# Patient Record
Sex: Male | Born: 1955 | Race: White | Hispanic: No | Marital: Single | Smoking: Former smoker
Health system: Southern US, Community
[De-identification: ages and names within clinical notes are randomized; demographics above are authoritative.]

## PROBLEM LIST (undated history)

## (undated) DIAGNOSIS — M1990 Unspecified osteoarthritis, unspecified site: Secondary | ICD-10-CM

## (undated) DIAGNOSIS — K219 Gastro-esophageal reflux disease without esophagitis: Secondary | ICD-10-CM

## (undated) DIAGNOSIS — I10 Essential (primary) hypertension: Secondary | ICD-10-CM

## (undated) DIAGNOSIS — Z8619 Personal history of other infectious and parasitic diseases: Secondary | ICD-10-CM

## (undated) DIAGNOSIS — D649 Anemia, unspecified: Secondary | ICD-10-CM

## (undated) DIAGNOSIS — G4730 Sleep apnea, unspecified: Secondary | ICD-10-CM

## (undated) DIAGNOSIS — G629 Polyneuropathy, unspecified: Secondary | ICD-10-CM

## (undated) DIAGNOSIS — Z8719 Personal history of other diseases of the digestive system: Secondary | ICD-10-CM

## (undated) DIAGNOSIS — K5903 Drug induced constipation: Secondary | ICD-10-CM

## (undated) HISTORY — PX: OTHER SURGICAL HISTORY: SHX169

## (undated) HISTORY — PX: BACK SURGERY: SHX140

## (undated) HISTORY — PX: ANTERIOR CRUCIATE LIGAMENT REPAIR: SHX115

## (undated) HISTORY — PX: JOINT REPLACEMENT: SHX530

## (undated) HISTORY — PX: MENISCUS REPAIR: SHX5179

---

## 1998-02-10 ENCOUNTER — Other Ambulatory Visit: Admission: RE | Admit: 1998-02-10 | Discharge: 1998-02-10 | Payer: Self-pay | Admitting: *Deleted

## 2003-09-20 HISTORY — PX: CARDIAC CATHETERIZATION: SHX172

## 2004-01-28 ENCOUNTER — Inpatient Hospital Stay (HOSPITAL_COMMUNITY): Admission: EM | Admit: 2004-01-28 | Discharge: 2004-01-29 | Payer: Self-pay | Admitting: Emergency Medicine

## 2006-04-16 ENCOUNTER — Encounter: Admission: RE | Admit: 2006-04-16 | Discharge: 2006-04-16 | Payer: Self-pay | Admitting: Orthopedic Surgery

## 2009-03-18 ENCOUNTER — Inpatient Hospital Stay (HOSPITAL_COMMUNITY): Admission: RE | Admit: 2009-03-18 | Discharge: 2009-03-21 | Payer: Self-pay | Admitting: Orthopedic Surgery

## 2009-04-10 ENCOUNTER — Encounter (INDEPENDENT_AMBULATORY_CARE_PROVIDER_SITE_OTHER): Payer: Self-pay | Admitting: Orthopedic Surgery

## 2009-04-10 ENCOUNTER — Inpatient Hospital Stay (HOSPITAL_COMMUNITY): Admission: AD | Admit: 2009-04-10 | Discharge: 2009-04-16 | Payer: Self-pay | Admitting: Orthopedic Surgery

## 2009-04-11 ENCOUNTER — Ambulatory Visit: Payer: Self-pay | Admitting: Infectious Diseases

## 2009-04-13 ENCOUNTER — Encounter: Payer: Self-pay | Admitting: Infectious Disease

## 2009-04-27 ENCOUNTER — Encounter: Payer: Self-pay | Admitting: Infectious Disease

## 2009-04-28 ENCOUNTER — Telehealth: Payer: Self-pay | Admitting: Infectious Disease

## 2009-05-04 ENCOUNTER — Encounter: Payer: Self-pay | Admitting: Infectious Disease

## 2009-05-08 ENCOUNTER — Encounter: Payer: Self-pay | Admitting: Infectious Diseases

## 2009-05-11 ENCOUNTER — Encounter: Payer: Self-pay | Admitting: Vascular Surgery

## 2009-05-11 ENCOUNTER — Ambulatory Visit: Payer: Self-pay | Admitting: Infectious Diseases

## 2009-05-11 DIAGNOSIS — T8489XA Other specified complication of internal orthopedic prosthetic devices, implants and grafts, initial encounter: Secondary | ICD-10-CM | POA: Insufficient documentation

## 2009-05-11 DIAGNOSIS — D638 Anemia in other chronic diseases classified elsewhere: Secondary | ICD-10-CM

## 2009-05-22 ENCOUNTER — Encounter: Payer: Self-pay | Admitting: Infectious Diseases

## 2009-05-26 ENCOUNTER — Ambulatory Visit: Payer: Self-pay | Admitting: Infectious Diseases

## 2009-07-21 ENCOUNTER — Ambulatory Visit: Payer: Self-pay | Admitting: Infectious Diseases

## 2009-07-21 DIAGNOSIS — R110 Nausea: Secondary | ICD-10-CM | POA: Insufficient documentation

## 2009-07-21 LAB — CONVERTED CEMR LAB
ALT: 11 units/L (ref 0–53)
Albumin: 4.2 g/dL (ref 3.5–5.2)
Alkaline Phosphatase: 60 units/L (ref 39–117)
Basophils Absolute: 0 10*3/uL (ref 0.0–0.1)
CRP: 0.3 mg/dL (ref <0.6)
Creatinine, Ser: 1.02 mg/dL (ref 0.40–1.50)
Eosinophils Absolute: 0.3 10*3/uL (ref 0.0–0.7)
HCT: 43.5 % (ref 39.0–52.0)
Hemoglobin: 14 g/dL (ref 13.0–17.0)
MCV: 89 fL (ref >=78.0)
Monocytes Relative: 13 % — ABNORMAL HIGH (ref 3–12)
Neutrophils Relative %: 45 % (ref 43–77)
RBC: 4.89 M/uL (ref 4.22–5.81)
RDW: 15.7 % — ABNORMAL HIGH (ref 11.5–15.5)
Sodium: 134 meq/L — ABNORMAL LOW (ref 135–145)
Total Bilirubin: 0.3 mg/dL (ref 0.3–1.2)
Total Protein: 7.9 g/dL (ref 6.0–8.3)
WBC: 5.6 10*3/uL (ref 4.0–10.5)

## 2009-10-20 ENCOUNTER — Ambulatory Visit: Payer: Self-pay | Admitting: Infectious Diseases

## 2009-10-20 LAB — CONVERTED CEMR LAB: Sed Rate: 8 mm/hr (ref 0–16)

## 2010-03-18 ENCOUNTER — Observation Stay (HOSPITAL_COMMUNITY): Admission: RE | Admit: 2010-03-18 | Discharge: 2010-03-19 | Payer: Self-pay | Admitting: Specialist

## 2010-10-19 NOTE — Assessment & Plan Note (Signed)
Summary: F/U/VS   Primary Provider:  Netta Corrigan  CC:  3 month follow up.  History of Present Illness: 55 yo with h/o oa and prior TKR.   Developed MSSA post op R TKR infection s/p I and D by Dr Netta Corrigan july 23 and july 25th.  On vanco and rif (has pcn allergy).  When seen oct 2010 was doing well with esr down to 5 on doxy and rifampin (had picc line removed in sept and started oral tail att that time. He finished rifampin several days ago. CURRENTLY He finished rifampin several days ago. He is back to work and quite active.  Has some occas stiffness but no redness, swelling or heat.  no fevers chills.   Knee feels fine per pt.   site healed, no pain fevers or chills.  only c/o is indigestion and thinks related to the abx,.  Continues on doxy and rifampin.   Preventive Screening-Counseling & Management  Alcohol-Tobacco     Alcohol drinks/day: 0     Smoking Status: quit     Year Quit: 2006  Caffeine-Diet-Exercise     Caffeine use/day: soda and coffee     Does Patient Exercise: yes     Type of exercise: active at work     Exercise (avg: min/session): >60     Times/week: 5  Safety-Violence-Falls     Seat Belt Use: yes   Updated Prior Medication List: DOXYCYCLINE HYCLATE 100 MG CAPS (DOXYCYCLINE HYCLATE) one by mouth two times a day  Current Allergies (reviewed today): ! PCN Past History:  Past Medical History: Last updated: 05/11/2009 1. He has a history of degenerative arthritis right knee.   2. Hypertension.   3. Hiatal hernia with reflux.   Past Surgical History: Last updated: 05/11/2009 TKR  Family History: Last updated: 05/11/2009 [place]  Social History: Last updated: 05/11/2009 works as truck Hospital doctor  Risk Factors: Alcohol Use: 0 (10/20/2009) Caffeine Use: soda and coffee (10/20/2009) Exercise: yes (10/20/2009)  Risk Factors: Smoking Status: quit (10/20/2009)  Review of Systems       11 systems reviewed and negative except per HPI   Vital  Signs:  Patient profile:   55 year old male Height:      72 inches (182.88 cm) Weight:      322.6 pounds (146.64 kg) BMI:     43.91 Temp:     98.1 degrees F (36.72 degrees C) oral Pulse rate:   87 / minute BP sitting:   139 / 84  (left arm)  Vitals Entered By: Baxter Hire) (October 20, 2009 10:34 AM) CC: 3 month follow up Is Patient Diabetic? No Pain Assessment Patient in pain? no      Nutritional Status BMI of > 30 = obese Nutritional Status Detail appetite is fine per patient  Does patient need assistance? Functional Status Self care Ambulation Normal   Physical Exam  General:  alert and well-developed.   Eyes:  vision grossly intact.   Mouth:  good dentition and no gingival abnormalities.   Neck:  supple.   Lungs:  normal respiratory effort and normal breath sounds.   Heart:  normal rate and regular rhythm.   Abdomen:  soft.   Msk:  normal ROM, no joint swelling, and no joint warmth.    R knee very well healed.   Extremities:  no cce Neurologic:  alert & oriented X3 and cranial nerves II-XII intact.     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  s/p 6 wks IV vanco and rifampin by mouth.  In Sept we pulled picc despite ESR of 115 and started po doxy and rifampin.  Seen in Oct and doing well and wound looks great.  Some indigestion with abx.  Will repeat esr today and cont doxy but decrease to once a day for lifelong suppression in order  to prevent recurrence and need for joint revision.  Can dc rifampin. He will return if any new or concenring sxs, otherwise he can follow with Dr Andrey Campanile for a yearly refill on his doxy. I have advised regarding photosensitivity with doxycycline.  Orders: T-Sed Rate (Automated) (29528-41324) Est. Patient Level IV (40102)  Problem # 2:  NAUSEA (ICD-787.02) should improved as abx dosing decreased  Patient  Instructions: 1)  Follow up as needed. 2)  Take the doxycyline once a day for lifelong. 3)    Call if new redness pain or swelling at knee or fevers or other symtptoms of concern Prescriptions: DOXYCYCLINE HYCLATE 100 MG CAPS (DOXYCYCLINE HYCLATE) one by mouth two times a day  #180 x 3   Entered and Authorized by:   Clydie Braun MD   Signed by:   Clydie Braun MD on 10/20/2009   Method used:   Print then Give to Patient   RxID:   [id number]  Process Orders Check Orders Results:     [facility]: ABN not required for this insurance Tests Sent for requisitioning (October 20, 2009 11:00 AM):     10/20/2009: [facility] -- T-Sed Rate (Automated) [[id number]] (signed)

## 2010-12-05 LAB — APTT: aPTT: 34 seconds (ref 24–37)

## 2010-12-05 LAB — COMPREHENSIVE METABOLIC PANEL
ALT: 17 U/L (ref 0–53)
AST: 27 U/L (ref 0–37)
Albumin: 4.2 g/dL (ref 3.5–5.2)
Alkaline Phosphatase: 49 U/L (ref 39–117)
BUN: 18 mg/dL (ref 6–23)
CO2: 27 mEq/L (ref 19–32)
Calcium: 10.3 mg/dL (ref 8.4–10.5)
Chloride: 100 mEq/L (ref 96–112)
Creatinine, Ser: 0.84 mg/dL (ref 0.4–1.5)
GFR calc Af Amer: >60 mL/min (ref >60)
GFR calc non Af Amer: >60 mL/min (ref >60)
Glucose, Bld: 94 mg/dL (ref 70–99)
Potassium: 4.4 mEq/L (ref 3.5–5.1)
Sodium: 136 mEq/L (ref 135–145)
Total Bilirubin: 0.6 mg/dL (ref 0.3–1.2)
Total Protein: 8 g/dL (ref 6.0–8.3)

## 2010-12-05 LAB — PROTIME-INR: Prothrombin Time: 13 seconds (ref 11.6–15.2)

## 2010-12-05 LAB — CBC
HCT: 44.6 % (ref 39.0–52.0)
Hemoglobin: 15.4 g/dL (ref 13.0–17.0)
MCH: 31.8 pg (ref 26.0–34.0)
MCHC: 34.7 g/dL (ref 30.0–36.0)
MCV: 91.7 fL (ref 78.0–100.0)
Platelets: 233 10*3/uL (ref 150–400)
RBC: 4.86 MIL/uL (ref 4.22–5.81)
RDW: 13 % (ref 11.5–15.5)
WBC: 6.7 10*3/uL (ref 4.0–10.5)

## 2010-12-05 LAB — URINALYSIS, ROUTINE W REFLEX MICROSCOPIC
Bilirubin Urine: NEGATIVE
Glucose, UA: NEGATIVE mg/dL
Hgb urine dipstick: NEGATIVE
Ketones, ur: NEGATIVE mg/dL
Nitrite: NEGATIVE
Protein, ur: NEGATIVE mg/dL
Specific Gravity, Urine: 1.015 (ref 1.005–1.030)
Urobilinogen, UA: 0.2 mg/dL (ref 0.0–1.0)
pH: 6.5 (ref 5.0–8.0)

## 2010-12-26 LAB — URINE MICROSCOPIC-ADD ON

## 2010-12-26 LAB — PROTIME-INR
INR: 1.1 (ref 0.00–1.49)
INR: 1.2 (ref 0.00–1.49)
INR: 1.2 (ref 0.00–1.49)
INR: 1.2 (ref 0.00–1.49)
INR: 1.3 (ref 0.00–1.49)
INR: 1.6 — ABNORMAL HIGH (ref 0.00–1.49)
INR: 1.5 (ref 0.00–1.49)
Prothrombin Time: 17 seconds — ABNORMAL HIGH (ref 11.6–15.2)
Prothrombin Time: 19.4 seconds — ABNORMAL HIGH (ref 11.6–15.2)
Prothrombin Time: 14 seconds (ref 11.6–15.2)
Prothrombin Time: 17.4 seconds — ABNORMAL HIGH (ref 11.6–15.2)
Prothrombin Time: 19.1 seconds — ABNORMAL HIGH (ref 11.6–15.2)

## 2010-12-26 LAB — CBC
HCT: 27.5 % — ABNORMAL LOW (ref 39.0–52.0)
HCT: 29.1 % — ABNORMAL LOW (ref 39.0–52.0)
HCT: 29.1 % — ABNORMAL LOW (ref 39.0–52.0)
HCT: 30.3 % — ABNORMAL LOW (ref 39.0–52.0)
HCT: 32.2 % — ABNORMAL LOW (ref 39.0–52.0)
HCT: 34.4 % — ABNORMAL LOW (ref 39.0–52.0)
Hemoglobin: 11.6 g/dL — ABNORMAL LOW (ref 13.0–17.0)
Hemoglobin: 9.3 g/dL — ABNORMAL LOW (ref 13.0–17.0)
Hemoglobin: 9.6 g/dL — ABNORMAL LOW (ref 13.0–17.0)
Hemoglobin: 9.9 g/dL — ABNORMAL LOW (ref 13.0–17.0)
MCHC: 32 g/dL (ref 30.0–36.0)
MCHC: 33 g/dL (ref 30.0–36.0)
MCHC: 33.7 g/dL (ref 30.0–36.0)
MCHC: 33.7 g/dL (ref 30.0–36.0)
MCV: 90.8 fL (ref 78.0–100.0)
MCV: 91.2 fL (ref 78.0–100.0)
Platelets: 333 10*3/uL (ref 150–400)
Platelets: 342 10*3/uL (ref 150–400)
Platelets: 358 10*3/uL (ref 150–400)
Platelets: 379 10*3/uL (ref 150–400)
RBC: 3.21 MIL/uL — ABNORMAL LOW (ref 4.22–5.81)
RBC: 3.48 MIL/uL — ABNORMAL LOW (ref 4.22–5.81)
RBC: 3.81 MIL/uL — ABNORMAL LOW (ref 4.22–5.81)
RDW: 13.8 % (ref 11.5–15.5)
RDW: 13.9 % (ref 11.5–15.5)
RDW: 14.2 % (ref 11.5–15.5)
RDW: 14.3 % (ref 11.5–15.5)
RDW: 15 % (ref 11.5–15.5)
WBC: 11.4 10*3/uL — ABNORMAL HIGH (ref 4.0–10.5)
WBC: 19 10*3/uL — ABNORMAL HIGH (ref 4.0–10.5)
WBC: 7.2 10*3/uL (ref 4.0–10.5)
WBC: 7.6 10*3/uL (ref 4.0–10.5)

## 2010-12-26 LAB — BODY FLUID CULTURE

## 2010-12-26 LAB — COMPREHENSIVE METABOLIC PANEL
ALT: 14 U/L (ref 0–53)
Alkaline Phosphatase: 66 U/L (ref 39–117)
Alkaline Phosphatase: 48 U/L (ref 39–117)
BUN: 20 mg/dL (ref 6–23)
BUN: 26 mg/dL — ABNORMAL HIGH (ref 6–23)
CO2: 25 mEq/L (ref 19–32)
CO2: 25 mEq/L (ref 19–32)
Calcium: 9.2 mg/dL (ref 8.4–10.5)
Chloride: 98 mEq/L (ref 96–112)
GFR calc non Af Amer: 36 mL/min — ABNORMAL LOW (ref >60)
Glucose, Bld: 140 mg/dL — ABNORMAL HIGH (ref 70–99)
Potassium: 3.9 mEq/L (ref 3.5–5.1)
Potassium: 5.3 mEq/L — ABNORMAL HIGH (ref 3.5–5.1)
Sodium: 130 mEq/L — ABNORMAL LOW (ref 135–145)
Sodium: 132 mEq/L — ABNORMAL LOW (ref 135–145)
Total Bilirubin: 0.7 mg/dL (ref 0.3–1.2)
Total Protein: 6.4 g/dL (ref 6.0–8.3)

## 2010-12-26 LAB — BASIC METABOLIC PANEL
BUN: 17 mg/dL (ref 6–23)
CO2: 28 mEq/L (ref 19–32)
Calcium: 8.8 mg/dL (ref 8.4–10.5)
Chloride: 98 mEq/L (ref 96–112)
Creatinine, Ser: 1.89 mg/dL — ABNORMAL HIGH (ref 0.4–1.5)
GFR calc Af Amer: 46 mL/min — ABNORMAL LOW (ref >60)
GFR calc Af Amer: >60 mL/min (ref >60)
GFR calc non Af Amer: 38 mL/min — ABNORMAL LOW (ref >60)
GFR calc non Af Amer: >60 mL/min (ref >60)
Potassium: 4.4 mEq/L (ref 3.5–5.1)
Sodium: 127 mEq/L — ABNORMAL LOW (ref 135–145)
Sodium: 134 mEq/L — ABNORMAL LOW (ref 135–145)

## 2010-12-26 LAB — WOUND CULTURE

## 2010-12-26 LAB — ANAEROBIC CULTURE

## 2010-12-26 LAB — URINALYSIS, ROUTINE W REFLEX MICROSCOPIC
Hgb urine dipstick: NEGATIVE
Ketones, ur: NEGATIVE mg/dL
Leukocytes, UA: NEGATIVE
Specific Gravity, Urine: 1.027 (ref 1.005–1.030)
Urobilinogen, UA: 0.2 mg/dL (ref 0.0–1.0)

## 2010-12-26 LAB — HEMOGLOBIN AND HEMATOCRIT, BLOOD
HCT: 29.6 % — ABNORMAL LOW (ref 39.0–52.0)
HCT: 30 % — ABNORMAL LOW (ref 39.0–52.0)
HCT: 31.3 % — ABNORMAL LOW (ref 39.0–52.0)
HCT: 37.4 % — ABNORMAL LOW (ref 39.0–52.0)
Hemoglobin: 10.3 g/dL — ABNORMAL LOW (ref 13.0–17.0)
Hemoglobin: 10.3 g/dL — ABNORMAL LOW (ref 13.0–17.0)
Hemoglobin: 10.9 g/dL — ABNORMAL LOW (ref 13.0–17.0)

## 2010-12-26 LAB — CREATININE, SERUM
Creatinine, Ser: 0.8 mg/dL (ref 0.4–1.5)
GFR calc non Af Amer: >60 mL/min (ref >60)

## 2010-12-26 LAB — APTT
aPTT: 33 seconds (ref 24–37)
aPTT: 33 seconds (ref 24–37)
aPTT: 36 seconds (ref 24–37)
aPTT: 48 seconds — ABNORMAL HIGH (ref 24–37)
aPTT: 53 seconds — ABNORMAL HIGH (ref 24–37)

## 2010-12-26 LAB — VANCOMYCIN, TROUGH: Vancomycin Tr: 17.8 ug/mL (ref 10.0–20.0)

## 2010-12-27 LAB — COMPREHENSIVE METABOLIC PANEL
AST: 23 U/L (ref 0–37)
Albumin: 3.9 g/dL (ref 3.5–5.2)
Alkaline Phosphatase: 58 U/L (ref 39–117)
BUN: 16 mg/dL (ref 6–23)
GFR calc Af Amer: >60 mL/min (ref >60)
Potassium: 4.2 mEq/L (ref 3.5–5.1)
Total Protein: 7.5 g/dL (ref 6.0–8.3)

## 2010-12-27 LAB — URINALYSIS, ROUTINE W REFLEX MICROSCOPIC
Nitrite: NEGATIVE
Specific Gravity, Urine: 1.021 (ref 1.005–1.030)
pH: 6 (ref 5.0–8.0)

## 2010-12-27 LAB — CBC
HCT: 45.2 % (ref 39.0–52.0)
Platelets: 218 10*3/uL (ref 150–400)
RDW: 13.4 % (ref 11.5–15.5)
WBC: 8.6 10*3/uL (ref 4.0–10.5)

## 2010-12-27 LAB — DIFFERENTIAL
Eosinophils Relative: 4 % (ref 0–5)
Lymphocytes Relative: 27 % (ref 12–46)
Monocytes Absolute: 0.8 10*3/uL (ref 0.1–1.0)
Monocytes Relative: 9 % (ref 3–12)
Neutro Abs: 5.1 10*3/uL (ref 1.7–7.7)

## 2010-12-27 LAB — APTT: aPTT: 30 seconds (ref 24–37)

## 2010-12-27 LAB — TYPE AND SCREEN
ABO/RH(D): A POS
Antibody Screen: NEGATIVE

## 2011-02-01 NOTE — Discharge Summary (Signed)
[name], [name]               ACCOUNT NO.:  [id number]   MEDICAL RECORD NO.:  [id number]          PATIENT TYPE:  INP   LOCATION:  1615                         FACILITY:  [facility]   PHYSICIAN:  Georges Lynch. Gioffre, M.D.DATE OF BIRTH:  03-14-1956   DATE OF ADMISSION:  03/18/2009  DATE OF DISCHARGE:  03/21/2009                               DISCHARGE SUMMARY   ADMITTING DIAGNOSES:  1. Degenerative arthritis of right knee.  2. Hypertension.  3. Hiatal hernia with reflux.  4. Arthritis.   DISCHARGE DIAGNOSES:  1. Degenerative arthritis of right knee status post right total knee      replacement.  2. Hypertension.  3. Hiatal hernia with reflux.  4. Arthritis.  5. Postoperative hyponatremia.  6. Postoperative hyperkalemia.   PROCEDURE:  On 03/18/2009 right total knee and removal of two ACL screws  was performed by Dr. Darrelyn Hillock.  Assistant Rozell Searing, PA-C.   CONSULTATIONS:  None.   BRIEF HISTORY:  The patient is a 55 year old white male with end-stage  degenerative arthritis in his right knee.  The patient has had previous  ACL repair of the right knee, continues to have pain and now presents  for right total knee arthroplasty.   LABORATORY DATA:  Preoperative CBC shows a hemoglobin of 15.5 and  hematocrit of 45.2.  White cell count is 8.6 and platelets 218.  Chemistry panel is within normal limits.  PT INR 12.3 and 0.9 with a PTT  of 30.  Preoperative urinalysis is negative.  Serial CBCs were followed  during the patient's hospital stay.  The patient's hemoglobin dropped as  low at 10.3.  The patient was asymptomatic and did not require  transfusion.  The last hemoglobin and hematocrit prior to discharge was  10.3 and 29.6.  Serial BMETs were followed through the patient's  hospital stay.  The patient's sodium dropped to 132 and got as low at  127 postoperatively but increased to 134 on the day of discharge.  The  patient's potassium elevated to 5.3 postoperatively on  postoperative day  two but normalized to 4.1 by the next day.  Postoperative BUN and  creatinine elevated to 33 and 1.89.  This normalized to 17 and 1.08 by  discharge day.  The normalization was attributed to increase in IV  fluids and output.  EKG dated 11/07/2008:  Normal sinus rhythm, normal  axis, regular rate and rhythm.   HOSPITAL CARE:  The patient was admitted to [facility], tolerated the  procedure well, later transferred to the recovery room and then to an  orthopedic floor.  The patient was started on a PCA pump and p.o.  analgesics for pain control postoperatively.  The patient underwent  general anesthesia without complications.  On postoperative day one PCA  was discharged and the patient is to rely on p.o. medications for pain  control.  The patient has decreased urinary output and his labs reveal  an increase in his BUN and creatinine so IV fluids were increased.  The  patient received his first dose of heparin on the morning of  postoperative day one.  His  INR was 1.1.  The patient was able to  ambulate 114 feet with normal assistance on postoperative day one.  On  postoperative day two the patient's output has greatly improved.  His  BUN and creatinine remain elevated so we continue IV fluids and  encourage p.o. intake of fluids.  The patient's INR remains  subtherapeutic at 1.4.  Pharmacy continues heparin dosing.  On dressing  change the wound appears well healing without any signs of infection.  On postoperative day three also discharge day the patient's BUN and  creatinine have normalized and the patient's output has greatly  improved.  IV fluids are discharged.  The patient is discharged home.  He has been in contact with [facility] for home care and the patient has  good family support.  Hemoglobin on discharge is 10.3.  The patient is  asymptomatic as far as dizziness or feeling light-headed.  The patient's  INR is up to 1.5.  The patient will continue heparin  subcutaneously for  the next three days and then we will transition to Coumadin.  This will  be monitored by home health nurse.   DISCHARGE PLAN:  The patient is discharged home.  He will be seen  [place] for home therapy.  The patient will continue heparin times three  days and then will be on Coumadin for three weeks from the date of  surgery.  He will have weekly INRs taken by the home health nurse.   DISCHARGE DIAGNOSES:  Please see above.   DISCHARGE MEDICATIONS:  1. Lovenox 40 mg subcutaneous once daily at 11:00 times three days.  2. Coumadin once the Lovenox is finished given per [facility] pharmacy.  3. Percocet 10/650 one to two tablets p.o. q. 4-6 h. p.r.n. pain.  4. Robaxin 500 mg one tablet p.o. t.i.d. p.r.n. spasm.  5. Niaspan extended release 1000 mg p.o. two tablets at night time.  6. Nexium 40 mg p.o. one tablet at night time.  7. Lisinopril/hydrochlorothiazide 20/12.5 mg one tablet at night time.  8. Aspirin 325 mg.  Hold until the patient is off Coumadin.  9. Ibuprofen 200 mg. Hold until the patient is off Coumadin.   DIET:  Regular.  No restrictions.   ACTIVITY:  The patient is weightbearing as tolerated on his right lower  extremity.  [name] to come to the patient's home to continue gait  training, physical therapy and activities of daily living.  Patient may  start showing today.  He is not to submerge the wound and we have  discussed daily wound changes.   FOLLOWUP:  The patient needs to follow up in two weeks from the day of  surgery.  Please contact the office at [phone] to arrange appointment.   DISPOSITION:  Home.   CONDITION ON DISCHARGE:  Stable, doing very well.      Rozell Searing, PAC    ______________________________  Georges Lynch Darrelyn Hillock, M.D.   LD/MEDQ  D:  04/02/2009  T:  04/02/2009  Job:  [id number]

## 2011-02-01 NOTE — Op Note (Signed)
NAMEMAUDE, [name]               ACCOUNT NO.:  [id number]   MEDICAL RECORD NO.:  [id number]          PATIENT TYPE:  INP   LOCATION:  1617                         FACILITY:  [facility]   PHYSICIAN:  Georges Lynch. Gioffre, M.D.DATE OF BIRTH:  Aug 11, 1956   DATE OF PROCEDURE:  04/10/2009  DATE OF DISCHARGE:                               OPERATIVE REPORT   SURGEON:  Georges Lynch. Darrelyn Hillock, M.D.   ASSISTANT:  Nurse.   PREOPERATIVE DIAGNOSIS:  Rule out infected right total knee  arthroplasty.   POSTOPERATIVE DIAGNOSIS:  Rule out infected right total knee  arthroplasty.   OPERATION:  Incision and drainage and debridement of the right total  knee.   PROCEDURE:  Under general anesthesia routine orthopedic prep and draping  of the knee was carried out on the right.  No tourniquet was used.  The  antibiotics were withheld until the cultures were taken.  An incision  was made over the lower part of the original incision.  At this  particular time, there appeared to be a fatty necrotic type material  that was extruded from the wound.  There were multiple pieces of this  material.  I thoroughly suctioned out after I did cultures, and I sent  the material down for aerobic and anaerobic cultures and sensitivities  and Gram's stain.  I thoroughly water-picked the knee out with a pulse  irrigation system.  Following that, I inserted about 300 mL of  irrigation with antibiotic solution.  I then packed the wound open with  a antibiotic-soaked Kerlix.  A small part of the wound was closed  distally, the remaining was left open.  Note:  The patient was on  Coumadin preop, so I had to give him 10 mg of vitamin K.  His INR came  down to 1.6, and I felt because of the emergent need here that we should  proceed, and we did do well with that and had good control the bleeding.  The patient then was started on IV vancomycin following the cultures.  The plan will be to admit him the hospital, wait for the cultures, and  I  did call infectious disease for a consultation and follow-up.           ______________________________  Georges Lynch. Darrelyn Hillock, M.D.     RAG/MEDQ  D:  04/10/2009  T:  04/11/2009  Job:  [id number]

## 2011-02-01 NOTE — H&P (Signed)
[name], [name]               ACCOUNT NO.:  [id number]   MEDICAL RECORD NO.:  [id number]          PATIENT TYPE:  INP   LOCATION:  1617                         [facility]ACILITY:  [facility]   PHYSICIAN:  Georges Lynch. Gioffre, M.D.DATE OF BIRTH:  1956-02-28   DATE OF ADMISSION:  04/10/2009  DATE OF DISCHARGE:                              HISTORY & PHYSICAL   CHIEF COMPLAINT:  Pain and some drainage in the right total knee distal  wound site.  He is 3-1/2 weeks postop right total knee.   PAST MEDICAL HISTORY:  1. He has a history of degenerative arthritis right knee.  2. Hypertension.  3. Hiatal hernia with reflux.   SOCIAL HISTORY:  Negative.   ALLERGIES:  PENICILLIN.   MEDICATIONS:  Lisinopril, Nexium and Niaspan.  He was on Coumadin.   REVIEW OF SYSTEMS:  He has history of hypertension and a hiatal hernia.   PHYSICAL EXAMINATION:  VITAL SIGNS:  His blood pressure was 127/53,  pulse 102, respirations normal, temperature 100.3.  HEENT:  Exam was negative.  Oral cavity negative.  LUNGS:  Clear.  HEART:  Normal sinus rhythm with no murmur.  ABDOMEN:  Negative.  EXTREMITIES:  Left lower extremity normal.  Right lower extremity he has  an incision in the right lower extremity.  He had a small area about 1 x  1 cm that was draining some necrotic like material.  There is a question  of an infection here at this point.   X-RAYS:  None were necessary.   ADMITTING IMPRESSION:  1. Three and a half weeks postop right total knee.  2. Rule out the possibility of a superficial knee infection.   PLAN:  I admitted him directly from the office to the hospital and  scheduled him for that night, same night, for incision and drainage of  his right total knee.           ______________________________  Georges Lynch Darrelyn Hillock, M.D.     RAG/MEDQ  D:  04/10/2009  T:  04/11/2009  Job:  [id number]

## 2011-02-01 NOTE — Op Note (Signed)
[name], [name]               ACCOUNT NO.:  [id number]   MEDICAL RECORD NO.:  [id number]          PATIENT TYPE:  INP   LOCATION:  0004                         FACILITY:  [facility]   PHYSICIAN:  Georges Lynch. Gioffre, M.D.DATE OF BIRTH:  June 28, 1956   DATE OF PROCEDURE:  03/18/2009  DATE OF DISCHARGE:                               OPERATIVE REPORT   SURGEON:  Georges Lynch. Darrelyn Hillock, M.D.   OPERATION PERFORMED:  Rozell Searing, PAC.   PREOPERATIVE DIAGNOSES:  1. Previous anterior cruciate reconstruction by me, right knee.  2. Severe degenerative arthritis, right knee.   POSTOPERATIVE DIAGNOSES:  1. Previous anterior cruciate reconstruction by me, right knee.  2. Severe degenerative arthritis, right knee.   OPERATION:  1. Removal of two anterior cruciate ligament screws, one from the      femur and one from the tibia.  2. Right total knee arthroplasty utilizing DePuy system.   All three components were cemented with gentamicin in the cement.  We  utilized the HV bone cement.  The sizes of the prosthesis used:  The  tibial tray was a size 5, the insert was a size 6, 10 mm thickness  insert.  The femoral component was a size 6 right posterior stabilized  prosthesis and the patella was a size 41 mm with three pegs.   PROCEDURE:  Under general anesthesia, routine orthopedic prep and  draping of the right lower extremity was carried out.  The leg was  exsanguinated with an Esmarch and the tourniquet was elevated at 370  mmHg.  The total tourniquet time was 2 hours.  I did let the tourniquet  down to complete the closure of the wound.  The patient had 1 gram of IV  Ancef preop.  At this time, after the tourniquet was up the knee was  flexed and we made an incision over the anterior aspect of the right  knee.  At this time, two flaps were created.  I then carried out a  median parapatellar incision reflecting the patella laterally, flexed  the knee and did medial and lateral meniscectomies.  I  excised the  cruciates at this time.  I first identified the tibial screw and removed  that.  I then identified the femoral screw and removed that as well.  Following that, my initial drill hole was made in the femoral condyle  and the #1 jig was inserted and 12 mm thickness was removed the distal  femur.  Note, this knee was extremely tight, he is an extremely large  individual so a great deal of time was taken to do all our proper  releases.  Following this, a #2 jig was inserted.  We measured the femur  to be a size 6 femur.  Following that, we made our appropriate cuts with  our next jig for anterior, posterior and chamfer cuts for the right  femur.  After the femur was prepared we then prepared our tibia in the  usual fashion.  I removed the initially 12 mm thickness off the distal  femur.  For the tibia, we removed 4 mm off  the affected medial side.  We  utilized the intramedullary devices and to make our cuts.  We then cut  our keel cut out of the tibial plateau in the usual fashion.  Once this  was done we then cut our notch cut out of the distal femur.  We did that  after we removed the posterior spurs from his femur.  Great care was  taken to protect the posterior soft tissue structures.  We thoroughly  irrigated out the knee.  We made our groove cut out of the distal femur.  We inserted our trial components.  We had excellent stability, excellent  flexion and extension with a 10-mm thickness insert.  We then prepared  our patella.  We did a resurfacing procedure on the patella for a size  41 patella.  Three drill holes were made in the articular surface of the  patella.  Following that, we then removed all the trial components,  thoroughly water picked out the knee and cemented all three components  in simultaneously.  Note, I did put a bone plug in the screw hole of the  proximal tibia.  After the cement was hardened we removed all loose  pieces of cement.  We removed our trial  components of the tibia and then  thoroughly irrigated out the knee and inserted some more cement.  We  then packed a thrombin soaked Gelfoam posteriorly with some FloSeal.  I  then inserted my permanent rotating platform a size 6, 10 mm thickness,  reduced the knee.  We had excellent function.  I then inserted my  Hemovac drain and closed the wound in layers in the usual fashion.  The  tourniquet was let down with 2 hours and the remaining part of the soft  tissue structures were closed in the usual fashion.  Skin was closed  with metal staples.  Sterile Neosporin dressing was applied.   SURGEON:  Georges Lynch. Darrelyn Hillock, M.D.   ASSISTANT:  Rozell Searing, PAC.           ______________________________  Georges Lynch. Darrelyn Hillock, M.D.     RAG/MEDQ  D:  03/18/2009  T:  03/18/2009  Job:  [id number]

## 2011-02-01 NOTE — Discharge Summary (Signed)
NAMEMURTAZA, SHELL               ACCOUNT NO.:  [id number]   MEDICAL RECORD NO.:  [id number]          PATIENT TYPE:  INP   LOCATION:  1617                         FACILITY:  [facility]   PHYSICIAN:  Georges Lynch. Gioffre, M.D.DATE OF BIRTH:  14-May-1956   DATE OF ADMISSION:  04/10/2009  DATE OF DISCHARGE:  04/16/2009                               DISCHARGE SUMMARY   Mr. Glassco was admitted to the hospital by me with an infection of his  right total knee arthroplasty.  He was taken to surgery twice for this  debridement; the first time was on April 10, 2009 at 6:35 p.m.  He was  taken into surgery, and we did an incision and drainage and debridement  of his right total knee and cultures for aerobic and anaerobic.  Within  48 hours, he was taken back a second time on April 11, 2009 at 8:30 a.m.  Sunday morning for incision, drainage and debridement of his knee in  arthrotomy, as well.  His wound looked much better the second time  around.  Cultures were taken once again.  The initial culture showed  staph aureus.  The second culture, the second surgery, showed a few  staph aureus.  I immediately anticoagulated him with Lovenox plus  Coumadin.  I did call infectious disease immediately, and he was managed  with vancomycin IV.  His vancomycin was started after his first  debridement and after the cultures were taken.  Initially when he came  in, he was on Coumadin.  His INR was 1.6.  He was given some vitamin K.  We were able to easily control his bleeding the first time around.  There was no real problem, and no tourniquet was necessary.  Initially  his white count was 19,000.  Postoperatively, his white count returned  to normal.  He remained afebrile while in the hospital.  His wound was  packed open with antibiotic-soaked Kerlix, and that final one was  removed the day following his last debridement.  He was seen and managed  by infectious disease and started on rifampin 300 mg b.i.d., as well  as  the vancomycin protocol.  On April 13, 2009, he had his PICC line in, and  his wound vac was ordered.  His wound vac was monitored on a daily  basis.  I did notify Genevieve [facility]lander for plans for home health care.  I would  like to say, though, when I did his arthrotomy, there was just serous  fluid.  The arthrotomy was not done until I first cleaned out the  superficial infection.  I think his infection was mainly superficial.  He had necrotic fatty tissue.  There was no purulent material expressed  from the joint per se.  He remained afebrile, as I mentioned.  He was  seen on a daily basis, and finally on April 16, 2009, I elected to  discharge him to be followed in the office.  He was doing extremely  well.  I consulted with the [place] representative today, and we went  over all the instructions.   The initial lab when  the patient came in, his INR was 1.6, his PT was  19.4.  White count was elevated when he was admitted.  It was 19,000.  Hemoglobin was 11.6, hematocrit 34, platelets 342.  As I mentioned, the  INR was 1.6.  Prothrombin time 19.6.  His sodium was 130, potassium 3.9,  chloride 98, glucose 109, BUN 20, creatinine 1.32.  The bilirubin was  0.7, alkaline phosphatase normal at 66, the SGOT normal at 20, SGPT  normal at 11.  The urinalysis was within normal limits.  We did follow  his BUN and creatinine according to the protocol for vancomycin.  PICC  line was inserted, as I mentioned.  X-ray showed it was good position.   DISCHARGE DIET:  He will remain on his regular diet.   DISCHARGE MEDICATIONS:  1. Niaspan 1000 mg h.s.  2. Lisinopril 20 mg h.s.  3. Hydrochlorothiazide 12.5 mg h.s.  4. Rifampin 300 mg b.i.d.  5. He will be on the vancomycin protocol, and the dosage on the      vancomycin will be 2250 mg q.12h., and his vancomycin trough will      be followed at home.  6. Coumadin 5 mg a day.  His INRs will be monitored.  7. Robaxin 500 mg t.i.d. p.r.n. for spasms.  8.  Percocet 10/650 one every 4 hours p.r.n. for pain.  9. Nexium 40 mg at bedtime.   DISCHARGE CONDITION:  Improved.   DISCHARGE INSTRUCTIONS:  1. He will have to have his vancomycin levels measured along with a      BUN and creatinine.  Will do the routine protocol.  2. His INR needs to be managed at least once weekly.  3. He will be on Coumadin 5 mg which will need to be adjusted      accordingly a day.  4. He will be on his medications as ordered.  5. He will be on his wound vac.  It will be changed every 3 days.  6. He needs to see me in the office Monday morning.  I will remove his      wound vac, examine his wound, and the wound vac will be reapplied      Monday afternoon.  7. He can ambulate full weightbearing with his walker.           ______________________________  Georges Lynch Darrelyn Hillock, M.D.     RAG/MEDQ  D:  04/16/2009  T:  04/16/2009  Job:  [id number]

## 2011-02-01 NOTE — Op Note (Signed)
[name], [name]               ACCOUNT NO.:  [id number]   MEDICAL RECORD NO.:  [id number]          PATIENT TYPE:  INP   LOCATION:  1617                         FACILITY:  [facility]   PHYSICIAN:  Georges Lynch. Gioffre, M.D.DATE OF BIRTH:  31-May-1956   DATE OF PROCEDURE:  04/12/2009  DATE OF DISCHARGE:                               OPERATIVE REPORT   Operative note #3.   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   Operation #1 was removing of packing of the right knee.  Operation #2 was irrigation and debridement right knee.   OPERATION #3:  Arthrotomy right knee.   SURGEON:  Ranee Gosselin, MD.   ASSISTANT:  Nurse.   PROCEDURE:  Under general anesthesia routine prep and then a sterile  prep of the right lower extremity was carried out.  No tourniquet was  used.  At this time I removed the few old sutures that I put in 2 days  ago.  The wound was reopened and thoroughly irrigated superficially as  is up in the subcutaneous area first.  He had some material that looked  like some purulent material from the lateral gutter of the subcu area.  New cultures were taken so we will label these cultures #2 from the knee  on the second postoperative day dated April 12, 2009.  These were sent to  the lab for aerobic and anaerobic cultures.  After I did a thorough  irrigation with a pulse lavage I then irrigated with antibiotic  solution.  I debrided the area.  The tissue looked real nice and clean.  I then made a small arthrotomy incision, went in and the only fluid I  obtained there was some serous-like fluid.  There was no signs of any  purulent material.  I utilized a pulse lavage and irrigated at the joint  as well thoroughly.  I then irrigated with antibiotic solution.  The  small part of the arthrotomy site was left open for drainage purposes.  I then packed the wound open again with antibiotic soaked Kerlix,  that  was double antibiotics.  Sterile dressings were applied.  Note, I had  infectious disease see him yesterday, started him on rifampin and added  that to the vancomycin that I started him on Friday night.  We will have  pharmacy monitor that.  The patient also was given Lovenox yesterday and  he will be on Lovenox protocol here until his Coumadin levels take into  effect.           ______________________________  Georges Lynch. Darrelyn Hillock, M.D.     RAG/MEDQ  D:  04/12/2009  T:  04/12/2009  Job:  [id number]   cc:   Mick Sell, MD

## 2014-01-29 ENCOUNTER — Other Ambulatory Visit: Payer: Self-pay | Admitting: Gastroenterology

## 2014-01-30 ENCOUNTER — Encounter (HOSPITAL_COMMUNITY): Payer: Self-pay | Admitting: Pharmacy Technician

## 2014-02-19 ENCOUNTER — Encounter (HOSPITAL_COMMUNITY): Payer: Self-pay | Admitting: *Deleted

## 2014-02-21 ENCOUNTER — Ambulatory Visit (HOSPITAL_COMMUNITY)
Admission: RE | Admit: 2014-02-21 | Discharge: 2014-02-21 | Disposition: A | Discharge status: Home / Self Care | Payer: Medicare Other | Source: Ambulatory Visit | Attending: Gastroenterology | Admitting: Gastroenterology

## 2014-02-21 ENCOUNTER — Encounter (HOSPITAL_COMMUNITY): Payer: Medicare Other | Admitting: Certified Registered"

## 2014-02-21 ENCOUNTER — Encounter (HOSPITAL_COMMUNITY): Payer: Self-pay | Admitting: Certified Registered"

## 2014-02-21 ENCOUNTER — Ambulatory Visit (HOSPITAL_COMMUNITY): Payer: Medicare Other | Admitting: Certified Registered"

## 2014-02-21 ENCOUNTER — Encounter (HOSPITAL_COMMUNITY)
Admission: RE | Disposition: A | Discharge status: Home / Self Care | Payer: Self-pay | Source: Ambulatory Visit | Attending: Gastroenterology

## 2014-02-21 DIAGNOSIS — K219 Gastro-esophageal reflux disease without esophagitis: Secondary | ICD-10-CM | POA: Insufficient documentation

## 2014-02-21 DIAGNOSIS — G4730 Sleep apnea, unspecified: Secondary | ICD-10-CM | POA: Insufficient documentation

## 2014-02-21 DIAGNOSIS — I10 Essential (primary) hypertension: Secondary | ICD-10-CM | POA: Insufficient documentation

## 2014-02-21 DIAGNOSIS — Z6841 Body Mass Index (BMI) 40.0 and over, adult: Secondary | ICD-10-CM | POA: Insufficient documentation

## 2014-02-21 DIAGNOSIS — Z87891 Personal history of nicotine dependence: Secondary | ICD-10-CM | POA: Insufficient documentation

## 2014-02-21 DIAGNOSIS — Z1211 Encounter for screening for malignant neoplasm of colon: Secondary | ICD-10-CM | POA: Insufficient documentation

## 2014-02-21 DIAGNOSIS — Z96659 Presence of unspecified artificial knee joint: Secondary | ICD-10-CM | POA: Insufficient documentation

## 2014-02-21 HISTORY — DX: Gastro-esophageal reflux disease without esophagitis: K21.9

## 2014-02-21 HISTORY — PX: COLONOSCOPY WITH PROPOFOL: SHX5780

## 2014-02-21 HISTORY — DX: Sleep apnea, unspecified: G47.30

## 2014-02-21 HISTORY — DX: Essential (primary) hypertension: I10

## 2014-02-21 SURGERY — COLONOSCOPY WITH PROPOFOL
Anesthesia: Monitor Anesthesia Care

## 2014-02-21 MED ORDER — PROPOFOL 10 MG/ML IV BOLUS
INTRAVENOUS | Status: AC
  Filled 2014-02-21: qty 20

## 2014-02-21 MED ORDER — LACTATED RINGERS IV SOLN
INTRAVENOUS | Status: DC
  Administered 2014-02-21: 09:00:00 via INTRAVENOUS

## 2014-02-21 MED ORDER — SODIUM CHLORIDE 0.9 % IV SOLN: INTRAVENOUS | Status: DC

## 2014-02-21 MED ORDER — PROPOFOL INFUSION 10 MG/ML OPTIME
INTRAVENOUS | Status: DC | PRN
  Administered 2014-02-21: 140 ug/kg/min via INTRAVENOUS

## 2014-02-21 MED ORDER — PROPOFOL 10 MG/ML IV BOLUS
INTRAVENOUS | Status: DC | PRN
  Administered 2014-02-21: 40 mg via INTRAVENOUS
  Administered 2014-02-21: 100 mg via INTRAVENOUS

## 2014-02-21 SURGICAL SUPPLY — 21 items

## 2014-02-21 NOTE — Anesthesia Preprocedure Evaluation (Addendum)
Anesthesia Evaluation  Patient identified by MRN, date of birth, ID band Patient awake    Reviewed: Allergy & Precautions, H&P , NPO status , Patient's Chart, lab work & pertinent test results  Airway Mallampati: III TM Distance: >3 FB Neck ROM: full    Dental no notable dental hx. (+) Poor Dentition, Missing, Dental Advisory Given Lateral front upper incissors are missing both sides.  All teeth poor condition:   Pulmonary sleep apnea , former smoker,  breath sounds clear to auscultation  Pulmonary exam normal       Cardiovascular Exercise Tolerance: Good hypertension, Pt. on medications Rhythm:regular Rate:Normal     Neuro/Psych negative neurological ROS  negative psych ROS   GI/Hepatic negative GI ROS, Neg liver ROS, GERD-  Medicated and Controlled,  Endo/Other  negative endocrine ROSMorbid obesity  Renal/GU negative Renal ROS  negative genitourinary   Musculoskeletal   Abdominal (+) + obese,   Peds  Hematology negative hematology ROS (+)   Anesthesia Other Findings   Reproductive/Obstetrics negative OB ROS                          Anesthesia Physical Anesthesia Plan  ASA: III  Anesthesia Plan: MAC   Post-op Pain Management:    Induction:   Airway Management Planned:   Additional Equipment:   Intra-op Plan:   Post-operative Plan:   Informed Consent: I have reviewed the patients History and Physical, chart, labs and discussed the procedure including the risks, benefits and alternatives for the proposed anesthesia with the patient or authorized representative who has indicated his/her understanding and acceptance.   Dental Advisory Given  Plan Discussed with: CRNA and Surgeon  Anesthesia Plan Comments:         Anesthesia Quick Evaluation

## 2014-02-21 NOTE — Anesthesia Postprocedure Evaluation (Signed)
  Anesthesia Post-op Note  Patient: [name]  Procedure(s) Performed: Procedure(s) (LRB): COLONOSCOPY WITH PROPOFOL (N/A)  Patient Location: PACU  Anesthesia Type: MAC  Level of Consciousness: awake and alert   Airway and Oxygen Therapy: Patient Spontanous Breathing  Post-op Pain: mild  Post-op Assessment: Post-op Vital signs reviewed, Patient's Cardiovascular Status Stable, Respiratory Function Stable, Patent Airway and No signs of Nausea or vomiting  Last Vitals:  Filed Vitals:   02/21/14 0950  BP: 142/81  Pulse: 73  Temp:   Resp: 16    Post-op Vital Signs: stable   Complications: No apparent anesthesia complications

## 2014-02-21 NOTE — Op Note (Signed)
[facility] [address] [place], [address]   OPERATIVE PROCEDURE REPORT  PATIENT: Raymond, Wolfe  MR#: [id number] BIRTHDATE: 07/07/1956  GENDER: Male ENDOSCOPIST: Jeani Hawking, MD ASSISTANT:   Jimmey Ralph, RN, CGRN Oletha Blend, technician PROCEDURE DATE: 02/21/2014 PROCEDURE:   Colonoscopy, diagnostic ASA CLASS:   Class III INDICATIONS:Colorectal cancer screening. MEDICATIONS: MAC sedation, administered by CRNA  DESCRIPTION OF PROCEDURE:   After the risks benefits and alternatives of the procedure were thoroughly explained, informed consent was obtained.  A digital rectal exam revealed no abnormalities of the rectum.    The Pentax Adult Colonscope [id number] endoscope was introduced through the anus  and advanced to the cecum, which was identified by both the appendix and ileocecal valve , No adverse events experienced.    The quality of the prep was excellent. .  The instrument was then slowly withdrawn as the colon was fully examined.     FINDINGS: A normal appearing cecum, ileocecal valve, and appendiceal orifice were identified.  The ascending, hepatic flexure, transverse, splenic flexure, descending, sigmoid colon and rectum appeared unremarkable.  No polyps or cancers were seen. Retroflexed views revealed no abnormalities.     The scope was then withdrawn from the patient and the procedure terminated.  COMPLICATIONS: There were no complications.  IMPRESSION: 1) Normal colon  RECOMMENDATIONS: 1) Repeat the colonoscopy in 10 years.  _______________________________ eSignedJeani Hawking, MD 02/21/2014 9:28 AM

## 2014-02-21 NOTE — Transfer of Care (Signed)
Immediate Anesthesia Transfer of Care Note  Patient: [name]  Procedure(s) Performed: Procedure(s): COLONOSCOPY WITH PROPOFOL (N/A)  Patient Location: PACU  Anesthesia Type:MAC  Level of Consciousness: awake, alert , oriented and patient cooperative  Airway & Oxygen Therapy: Patient Spontanous Breathing  Post-op Assessment: Report given to PACU RN, Post -op Vital signs reviewed and stable and Patient moving all extremities  Post vital signs: Reviewed and stable  Complications: No apparent anesthesia complications

## 2014-02-21 NOTE — H&P (Signed)
   [name] HPI: This is a 58 year old male here today for a screening colonoscopy.  No reports of any hematochezia, melena, or abdominal pain.  There is no famiily history of colon cancer.  He has sleep apnea.  Past Medical History  Diagnosis Date  . Hypertension   . Sleep apnea   . GERD (gastroesophageal reflux disease)     Past Surgical History  Procedure Laterality Date  . Joint replacement      right knee  . Back surgery      x 3  . Anterior cruciate ligament repair      right  . Arthroscopy      right knee    History reviewed. No pertinent family history.  Social History:  reports that he quit smoking about 9 years ago. His smoking use included Cigarettes. He smoked 0.00 packs per day. He does not have any smokeless tobacco history on file. He reports that he drinks alcohol. He reports that he does not use illicit drugs.  Allergies:  Allergies  Allergen Reactions  . Penicillins     REACTION: face became swollen (according to his mom when patient was a small child)    Medications:  Scheduled:  Continuous: . sodium chloride    . lactated ringers 10 mL/hr at 02/21/14 0839    No results found for this or any previous visit (from the past 24 hour(s)).   No results found.  ROS:  As stated above in the HPI otherwise negative.  Blood pressure 159/76, pulse 93, temperature 99 F (37.2 C), temperature source Oral, resp. rate 13, height 6' (1.829 m), weight 350 lb (158.759 kg), SpO2 97.00%.    PE: Gen: NAD, Alert and Oriented HEENT:  [place]/AT, EOMI Neck: Supple, no LAD Lungs: CTA Bilaterally CV: RRR without M/G/R ABM: Soft, NTND, +BS Ext: No C/C/E  Assessment/Plan: 1) Screening colonoscopy with anesthesia support.  Theda Belfast 02/21/2014, 8:53 AM

## 2014-02-21 NOTE — Discharge Instructions (Addendum)
Monitored Anesthesia Care  °Monitored anesthesia care is an anesthesia service for a medical procedure. Anesthesia is the loss of the ability to feel pain. It is produced by medications called anesthetics. It may affect a small area of your body (local anesthesia), a large area of your body (regional anesthesia), or your entire body (general anesthesia). The need for monitored anesthesia care depends your procedure, your condition, and the potential need for regional or general anesthesia. It is often provided during procedures where:  °· General anesthesia may be needed if there are complications. This is because you need special care when you are under general anesthesia.   °· You will be under local or regional anesthesia. This is so that you are able to have higher levels of anesthesia if needed.   °· You will receive calming medications (sedatives). This is especially the case if sedatives are given to put you in a semi-conscious state of relaxation (deep sedation). This is because the amount of sedative needed to produce this state can be hard to predict. Too much of a sedative can produce general anesthesia. °Monitored anesthesia care is performed by one or more caregivers who have special training in all types of anesthesia. You will need to meet with these caregivers before your procedure. During this meeting, they will ask you about your medical history. They will also give you instructions to follow. (For example, you will need to stop eating and drinking before your procedure. You may also need to stop or change medications you are taking.) During your procedure, your caregivers will stay with you. They will:  °· Watch your condition. This includes watching you blood pressure, breathing, and level of pain.   °· Diagnose and treat problems that occur.   °· Give medications if they are needed. These may include calming medications (sedatives) and anesthetics.   °· Make sure you are comfortable.   °Having  monitored anesthesia care does not necessarily mean that you will be under anesthesia. It does mean that your caregivers will be able to manage anesthesia if you need it or if it occurs. It also means that you will be able to have a different type of anesthesia than you are having if you need it. When your procedure is complete, your caregivers will continue to watch your condition. They will make sure any medications wear off before you are allowed to go home.  °Document Released: 06/01/2005 Document Revised: 12/31/2012 Document Reviewed: 10/17/2012 °ExitCare® Patient Information ©2014 [employer]. ° °

## 2014-02-24 ENCOUNTER — Encounter (HOSPITAL_COMMUNITY): Payer: Self-pay | Admitting: Gastroenterology

## 2014-10-20 ENCOUNTER — Other Ambulatory Visit: Payer: Self-pay | Admitting: Family Medicine

## 2014-10-20 DIAGNOSIS — G5792 Unspecified mononeuropathy of left lower limb: Secondary | ICD-10-CM

## 2014-10-20 DIAGNOSIS — M5137 Other intervertebral disc degeneration, lumbosacral region: Secondary | ICD-10-CM

## 2014-10-31 ENCOUNTER — Ambulatory Visit
Admission: RE | Admit: 2014-10-31 | Discharge: 2014-10-31 | Disposition: A | Discharge status: Home / Self Care | Payer: Medicare Other | Source: Ambulatory Visit | Attending: Family Medicine | Admitting: Family Medicine

## 2014-10-31 DIAGNOSIS — M5137 Other intervertebral disc degeneration, lumbosacral region: Secondary | ICD-10-CM

## 2014-10-31 DIAGNOSIS — G5792 Unspecified mononeuropathy of left lower limb: Secondary | ICD-10-CM

## 2014-10-31 MED ORDER — GADOBENATE DIMEGLUMINE 529 MG/ML IV SOLN
20.0000 mL | Freq: Once | INTRAVENOUS | Status: AC | PRN
  Administered 2014-10-31: 20 mL via INTRAVENOUS

## 2014-11-25 ENCOUNTER — Other Ambulatory Visit: Payer: Self-pay | Admitting: Neurosurgery

## 2014-11-25 DIAGNOSIS — M5416 Radiculopathy, lumbar region: Secondary | ICD-10-CM

## 2014-12-04 ENCOUNTER — Ambulatory Visit
Admission: RE | Admit: 2014-12-04 | Discharge: 2014-12-04 | Disposition: A | Discharge status: Home / Self Care | Payer: Medicare Other | Source: Ambulatory Visit | Attending: Neurosurgery | Admitting: Neurosurgery

## 2014-12-04 DIAGNOSIS — M5416 Radiculopathy, lumbar region: Secondary | ICD-10-CM

## 2014-12-04 MED ORDER — IOHEXOL 180 MG/ML  SOLN
15.0000 mL | Freq: Once | INTRAMUSCULAR | Status: AC | PRN
  Administered 2014-12-04: 15 mL via INTRATHECAL

## 2014-12-04 MED ORDER — DIAZEPAM 5 MG PO TABS
10.0000 mg | ORAL_TABLET | Freq: Once | ORAL | Status: AC
  Administered 2014-12-04: 10 mg via ORAL

## 2014-12-04 MED ORDER — DIPHENHYDRAMINE HCL 50 MG PO CAPS
50.0000 mg | ORAL_CAPSULE | Freq: Once | ORAL | Status: AC
  Administered 2014-12-04: 50 mg via ORAL

## 2014-12-04 NOTE — Discharge Instructions (Signed)
Myelogram Discharge Instructions  1. Go home and rest quietly for the next 24 hours.  It is important to lie flat for the next 24 hours.  Get up only to go to the restroom.  You may lie in the bed or on a couch on your back, your stomach, your left side or your right side.  You may have one pillow under your head.  You may have pillows between your knees while you are on your side or under your knees while you are on your back.  2. DO NOT drive today.  Recline the seat as far back as it will go, while still wearing your seat belt, on the way home.  3. You may get up to go to the bathroom as needed.  You may sit up for 10 minutes to eat.  You may resume your normal diet and medications unless otherwise indicated.  Drink plenty of extra fluids today and tomorrow.  4. The incidence of a spinal headache with nausea and/or vomiting is about 5% (one in 20 patients).  If you develop a headache, lie flat and drink plenty of fluids until the headache goes away.  Caffeinated beverages may be helpful.  If you develop severe nausea and vomiting or a headache that does not go away with flat bed rest, call [phone].  5. You may resume normal activities after your 24 hours of bed rest is over; however, do not exert yourself strongly or do any heavy lifting tomorrow.  6. Call your physician for a follow-up appointment.   You may resume Trazodone on Friday, December 05, 2014 after 7:30a.m.

## 2015-12-31 ENCOUNTER — Other Ambulatory Visit: Payer: Self-pay | Admitting: Neurosurgery

## 2015-12-31 DIAGNOSIS — M4316 Spondylolisthesis, lumbar region: Secondary | ICD-10-CM

## 2016-01-12 ENCOUNTER — Ambulatory Visit
Admission: RE | Admit: 2016-01-12 | Discharge: 2016-01-12 | Disposition: A | Discharge status: Home / Self Care | Payer: Medicare Other | Source: Ambulatory Visit | Attending: Neurosurgery | Admitting: Neurosurgery

## 2016-01-12 DIAGNOSIS — M4316 Spondylolisthesis, lumbar region: Secondary | ICD-10-CM

## 2016-01-12 MED ORDER — GADOBENATE DIMEGLUMINE 529 MG/ML IV SOLN
20.0000 mL | Freq: Once | INTRAVENOUS | Status: AC | PRN
  Administered 2016-01-12: 20 mL via INTRAVENOUS

## 2016-03-17 ENCOUNTER — Other Ambulatory Visit: Payer: Self-pay | Admitting: Neurosurgery

## 2016-03-25 ENCOUNTER — Encounter (HOSPITAL_COMMUNITY): Payer: Self-pay

## 2016-03-25 ENCOUNTER — Other Ambulatory Visit (HOSPITAL_COMMUNITY): Payer: Self-pay | Admitting: *Deleted

## 2016-03-25 ENCOUNTER — Encounter (HOSPITAL_COMMUNITY)
Admission: RE | Admit: 2016-03-25 | Discharge: 2016-03-25 | Disposition: A | Discharge status: Home / Self Care | Payer: Medicare Other | Source: Ambulatory Visit | Attending: Neurosurgery | Admitting: Neurosurgery

## 2016-03-25 DIAGNOSIS — Z0183 Encounter for blood typing: Secondary | ICD-10-CM | POA: Diagnosis not present

## 2016-03-25 DIAGNOSIS — I10 Essential (primary) hypertension: Secondary | ICD-10-CM | POA: Insufficient documentation

## 2016-03-25 DIAGNOSIS — M4310 Spondylolisthesis, site unspecified: Secondary | ICD-10-CM | POA: Insufficient documentation

## 2016-03-25 DIAGNOSIS — Z01812 Encounter for preprocedural laboratory examination: Secondary | ICD-10-CM | POA: Diagnosis not present

## 2016-03-25 DIAGNOSIS — Z01818 Encounter for other preprocedural examination: Secondary | ICD-10-CM | POA: Diagnosis present

## 2016-03-25 HISTORY — DX: Unspecified osteoarthritis, unspecified site: M19.90

## 2016-03-25 HISTORY — DX: Personal history of other diseases of the digestive system: Z87.19

## 2016-03-25 HISTORY — DX: Anemia, unspecified: D64.9

## 2016-03-25 HISTORY — DX: Personal history of other infectious and parasitic diseases: Z86.19

## 2016-03-25 HISTORY — DX: Polyneuropathy, unspecified: G62.9

## 2016-03-25 HISTORY — DX: Drug induced constipation: K59.03

## 2016-03-25 LAB — CBC
HEMATOCRIT: 44.4 % (ref 39.0–52.0)
Hemoglobin: 14.8 g/dL (ref 13.0–17.0)
MCH: 30.8 pg (ref 26.0–34.0)
MCHC: 33.3 g/dL (ref 30.0–36.0)
MCV: 92.3 fL (ref 78.0–100.0)
PLATELETS: 249 10*3/uL (ref 150–400)
RBC: 4.81 MIL/uL (ref 4.22–5.81)
RDW: 12.9 % (ref 11.5–15.5)
WBC: 6.6 10*3/uL (ref 4.0–10.5)

## 2016-03-25 LAB — BASIC METABOLIC PANEL
ANION GAP: 7 (ref 5–15)
BUN: 16 mg/dL (ref 6–20)
CALCIUM: 9.8 mg/dL (ref 8.9–10.3)
CO2: 27 mmol/L (ref 22–32)
CREATININE: 0.87 mg/dL (ref 0.61–1.24)
Chloride: 103 mmol/L (ref 101–111)
GFR calc Af Amer: >60 mL/min (ref >60)
GLUCOSE: 95 mg/dL (ref 65–99)
Potassium: 4.1 mmol/L (ref 3.5–5.1)
Sodium: 137 mmol/L (ref 135–145)

## 2016-03-25 LAB — TYPE AND SCREEN
ABO/RH(D): A POS
Antibody Screen: NEGATIVE

## 2016-03-25 LAB — SURGICAL PCR SCREEN
MRSA, PCR: POSITIVE — AB
STAPHYLOCOCCUS AUREUS: POSITIVE — AB

## 2016-03-25 LAB — ABO/RH: ABO/RH(D): A POS

## 2016-03-25 NOTE — Progress Notes (Signed)
Mupirocin Ointment Rx called into [facility] on [address] for positive PCR of MRSA. Attempted to call pt, phone just rings, no voicemail. Will call back later.

## 2016-03-25 NOTE — Progress Notes (Signed)
Pt denies cardiac history, chest pain or sob. 

## 2016-03-25 NOTE — Pre-Procedure Instructions (Signed)
[name]  03/25/2016     Your procedure is scheduled on Thursday, March 31, 2016 at 11:30 AM.   [facility] Admitting Office at 9:30 AM.   Call this number if you have problems the morning of surgery: [phone]   Any questions prior to day of surgery, please call [phone] between 8 & 4 PM.    Remember:  Do not eat food or drink liquids after midnight Wednesday, 03/30/16.  Take these medicines the morning of surgery with A SIP OF WATER: Gabapentin (Neurontin), Omeprazole (Prilosec), Flonase, Hydrocodone - if needed  Stop Aspirin, NSAIDS (Meloxicam, Ibuprofen, Aleve, etc.) and Multivitamins as of today.   Do not wear jewelry.  Do not wear lotions, powders, or cologne.  You may wear deoderant.  Men may shave face and neck.  Do not bring valuables to the hospital.  [facility] is not responsible for any belongings or valuables.  Contacts, dentures or bridgework may not be worn into surgery.  Leave your suitcase in the car.  After surgery it may be brought to your room.  For patients admitted to the hospital, discharge time will be determined by your treatment team.  Special instructions:  [name] - Preparing for Surgery  Before surgery, you can play an important role.  Because skin is not sterile, your skin needs to be as free of germs as possible.  You can reduce the number of germs on you skin by washing with CHG (chlorahexidine gluconate) soap before surgery.  CHG is an antiseptic cleaner which kills germs and bonds with the skin to continue killing germs even after washing.  Please DO NOT use if you have an allergy to CHG or antibacterial soaps.  If your skin becomes reddened/irritated stop using the CHG and inform your nurse when you arrive at Short Stay.  Do not shave (including legs and underarms) for at least 48 hours prior to the first CHG shower.  You may shave your face.  Please follow these instructions carefully:   1.   Shower with CHG Soap the night before surgery and the                                morning of Surgery.  2.  If you choose to wash your hair, wash your hair first as usual with your       normal shampoo.  3.  After you shampoo, rinse your hair and body thoroughly to remove the                      Shampoo.  4.  Use CHG as you would any other liquid soap.  You can apply chg directly       to the skin and wash gently with scrungie or a clean washcloth.  5.  Apply the CHG Soap to your body ONLY FROM THE NECK DOWN.        Do not use on open wounds or open sores.  Avoid contact with your eyes, ears, mouth and genitals (private parts).  Wash genitals (private parts) with your normal soap.  6.  Wash thoroughly, paying special attention to the area where your surgery        will be performed.  7.  Thoroughly rinse your body with warm water from the neck down.  8.  DO NOT shower/wash with your normal soap after  using and rinsing off       the CHG Soap.  9.  Pat yourself dry with a clean towel.            10.  Wear clean pajamas.            11.  Place clean sheets on your bed the night of your first shower and do not        sleep with pets.  Day of Surgery  Do not apply any lotions the morning of surgery.  Please wear clean clothes to the hospital.   Please read over the following fact sheets that you were given. Pain Booklet, Coughing and Deep Breathing, MRSA Information and Surgical Site Infection Prevention

## 2016-03-30 MED ORDER — VANCOMYCIN HCL 10 G IV SOLR
1500.0000 mg | INTRAVENOUS | Status: AC
  Filled 2016-03-30: qty 1500

## 2016-04-11 ENCOUNTER — Encounter (HOSPITAL_COMMUNITY): Payer: Self-pay | Admitting: *Deleted

## 2016-04-12 ENCOUNTER — Inpatient Hospital Stay (HOSPITAL_COMMUNITY)
Admission: RE | Admit: 2016-04-12 | Discharge: 2016-04-14 | DRG: 460 | Disposition: A | Discharge status: Home / Self Care | Payer: Medicare Other | Source: Ambulatory Visit | Attending: Neurosurgery | Admitting: Neurosurgery

## 2016-04-12 ENCOUNTER — Inpatient Hospital Stay (HOSPITAL_COMMUNITY): Payer: Medicare Other | Admitting: Certified Registered Nurse Anesthetist

## 2016-04-12 ENCOUNTER — Inpatient Hospital Stay (HOSPITAL_COMMUNITY): Payer: Medicare Other

## 2016-04-12 ENCOUNTER — Encounter (HOSPITAL_COMMUNITY)
Admission: RE | Disposition: A | Discharge status: Home / Self Care | Payer: Self-pay | Source: Ambulatory Visit | Attending: Neurosurgery

## 2016-04-12 DIAGNOSIS — M4317 Spondylolisthesis, lumbosacral region: Secondary | ICD-10-CM

## 2016-04-12 DIAGNOSIS — Z7982 Long term (current) use of aspirin: Secondary | ICD-10-CM

## 2016-04-12 DIAGNOSIS — Z79899 Other long term (current) drug therapy: Secondary | ICD-10-CM | POA: Diagnosis not present

## 2016-04-12 DIAGNOSIS — Z888 Allergy status to other drugs, medicaments and biological substances status: Secondary | ICD-10-CM

## 2016-04-12 DIAGNOSIS — Z419 Encounter for procedure for purposes other than remedying health state, unspecified: Secondary | ICD-10-CM

## 2016-04-12 DIAGNOSIS — M4806 Spinal stenosis, lumbar region: Secondary | ICD-10-CM | POA: Diagnosis present

## 2016-04-12 DIAGNOSIS — Z87891 Personal history of nicotine dependence: Secondary | ICD-10-CM | POA: Diagnosis not present

## 2016-04-12 DIAGNOSIS — G4730 Sleep apnea, unspecified: Secondary | ICD-10-CM | POA: Diagnosis present

## 2016-04-12 DIAGNOSIS — G629 Polyneuropathy, unspecified: Secondary | ICD-10-CM | POA: Diagnosis present

## 2016-04-12 DIAGNOSIS — K219 Gastro-esophageal reflux disease without esophagitis: Secondary | ICD-10-CM | POA: Diagnosis present

## 2016-04-12 DIAGNOSIS — Z96651 Presence of right artificial knee joint: Secondary | ICD-10-CM | POA: Diagnosis present

## 2016-04-12 DIAGNOSIS — I10 Essential (primary) hypertension: Secondary | ICD-10-CM | POA: Diagnosis present

## 2016-04-12 DIAGNOSIS — Z880 Allergy status to penicillin: Secondary | ICD-10-CM | POA: Diagnosis not present

## 2016-04-12 LAB — CBC
HEMATOCRIT: 44.7 % (ref 39.0–52.0)
HEMOGLOBIN: 15.3 g/dL (ref 13.0–17.0)
MCH: 31.5 pg (ref 26.0–34.0)
MCHC: 34.2 g/dL (ref 30.0–36.0)
MCV: 92 fL (ref 78.0–100.0)
Platelets: 248 10*3/uL (ref 150–400)
RBC: 4.86 MIL/uL (ref 4.22–5.81)
RDW: 13.1 % (ref 11.5–15.5)
WBC: 7.6 10*3/uL (ref 4.0–10.5)

## 2016-04-12 LAB — BASIC METABOLIC PANEL
ANION GAP: 10 (ref 5–15)
BUN: 13 mg/dL (ref 6–20)
CHLORIDE: 104 mmol/L (ref 101–111)
CO2: 23 mmol/L (ref 22–32)
CREATININE: 1.04 mg/dL (ref 0.61–1.24)
Calcium: 9.4 mg/dL (ref 8.9–10.3)
GFR calc non Af Amer: >60 mL/min (ref >60)
Glucose, Bld: 98 mg/dL (ref 65–99)
POTASSIUM: 3.4 mmol/L — AB (ref 3.5–5.1)
Sodium: 137 mmol/L (ref 135–145)

## 2016-04-12 LAB — TYPE AND SCREEN
ABO/RH(D): A POS
ANTIBODY SCREEN: NEGATIVE

## 2016-04-12 SURGERY — POSTERIOR LUMBAR FUSION 1 LEVEL
Anesthesia: General

## 2016-04-12 MED ORDER — LISINOPRIL-HYDROCHLOROTHIAZIDE 20-12.5 MG PO TABS: 1.0000 | ORAL_TABLET | Freq: Every morning | ORAL | Status: DC

## 2016-04-12 MED ORDER — NEOSTIGMINE METHYLSULFATE 5 MG/5ML IV SOSY
PREFILLED_SYRINGE | INTRAVENOUS | Status: AC
  Filled 2016-04-12: qty 5

## 2016-04-12 MED ORDER — LISINOPRIL 20 MG PO TABS
20.0000 mg | ORAL_TABLET | Freq: Every day | ORAL | Status: DC
  Administered 2016-04-12 – 2016-04-13 (×2): 20 mg via ORAL
  Filled 2016-04-12 (×2): qty 1

## 2016-04-12 MED ORDER — BUPIVACAINE LIPOSOME 1.3 % IJ SUSP
20.0000 mL | Freq: Once | INTRAMUSCULAR | Status: DC
  Filled 2016-04-12: qty 20

## 2016-04-12 MED ORDER — VANCOMYCIN HCL 10 G IV SOLR
1500.0000 mg | Freq: Once | INTRAVENOUS | Status: AC
  Administered 2016-04-13: 1500 mg via INTRAVENOUS
  Filled 2016-04-12: qty 1500

## 2016-04-12 MED ORDER — DEXTROSE 5 % IV SOLN
500.0000 mg | Freq: Four times a day (QID) | INTRAVENOUS | Status: DC | PRN
  Filled 2016-04-12: qty 5

## 2016-04-12 MED ORDER — METHOCARBAMOL 500 MG PO TABS
500.0000 mg | ORAL_TABLET | Freq: Four times a day (QID) | ORAL | Status: DC | PRN
  Administered 2016-04-12 – 2016-04-13 (×5): 500 mg via ORAL
  Filled 2016-04-12 (×4): qty 1

## 2016-04-12 MED ORDER — SENNA 8.6 MG PO TABS
1.0000 | ORAL_TABLET | Freq: Two times a day (BID) | ORAL | Status: DC
  Administered 2016-04-12 – 2016-04-13 (×3): 8.6 mg via ORAL
  Filled 2016-04-12 (×3): qty 1

## 2016-04-12 MED ORDER — THROMBIN 5000 UNITS EX SOLR
OROMUCOSAL | Status: DC | PRN
  Administered 2016-04-12: 14:00:00 via TOPICAL

## 2016-04-12 MED ORDER — LIDOCAINE-EPINEPHRINE 1 %-1:100000 IJ SOLN
INTRAMUSCULAR | Status: DC | PRN
  Administered 2016-04-12: 9 mL

## 2016-04-12 MED ORDER — CEPHALEXIN 500 MG PO CAPS
500.0000 mg | ORAL_CAPSULE | Freq: Two times a day (BID) | ORAL | Status: DC
  Administered 2016-04-12 – 2016-04-13 (×3): 500 mg via ORAL
  Filled 2016-04-12 (×4): qty 1

## 2016-04-12 MED ORDER — OXYCODONE-ACETAMINOPHEN 5-325 MG PO TABS
1.0000 | ORAL_TABLET | ORAL | Status: DC | PRN
  Administered 2016-04-12 – 2016-04-13 (×6): 2 via ORAL
  Filled 2016-04-12 (×5): qty 2

## 2016-04-12 MED ORDER — FENTANYL CITRATE (PF) 250 MCG/5ML IJ SOLN
INTRAMUSCULAR | Status: AC
  Filled 2016-04-12: qty 5

## 2016-04-12 MED ORDER — VANCOMYCIN HCL 1000 MG IV SOLR
INTRAVENOUS | Status: DC | PRN
  Administered 2016-04-12: 1000 mg via INTRAVENOUS

## 2016-04-12 MED ORDER — ONDANSETRON HCL 4 MG/2ML IJ SOLN: 4.0000 mg | INTRAMUSCULAR | Status: DC | PRN

## 2016-04-12 MED ORDER — SODIUM CHLORIDE 0.9 % IV SOLN: INTRAVENOUS | Status: DC

## 2016-04-12 MED ORDER — CHLORHEXIDINE GLUCONATE CLOTH 2 % EX PADS: 6.0000 | MEDICATED_PAD | Freq: Once | CUTANEOUS | Status: DC

## 2016-04-12 MED ORDER — ONDANSETRON HCL 4 MG/2ML IJ SOLN
INTRAMUSCULAR | Status: DC | PRN
  Administered 2016-04-12 (×2): 4 mg via INTRAVENOUS

## 2016-04-12 MED ORDER — MENTHOL 3 MG MT LOZG: 1.0000 | LOZENGE | OROMUCOSAL | Status: DC | PRN

## 2016-04-12 MED ORDER — SUCCINYLCHOLINE CHLORIDE 20 MG/ML IJ SOLN
INTRAMUSCULAR | Status: DC | PRN
  Administered 2016-04-12: 140 mg via INTRAVENOUS

## 2016-04-12 MED ORDER — SODIUM CHLORIDE 0.9% FLUSH: 3.0000 mL | Freq: Two times a day (BID) | INTRAVENOUS | Status: DC

## 2016-04-12 MED ORDER — PROMETHAZINE HCL 25 MG/ML IJ SOLN: 6.2500 mg | INTRAMUSCULAR | Status: DC | PRN

## 2016-04-12 MED ORDER — ROCURONIUM BROMIDE 50 MG/5ML IV SOLN
INTRAVENOUS | Status: AC
Start: 2016-04-12 — End: 2016-04-12
  Filled 2016-04-12: qty 1

## 2016-04-12 MED ORDER — ACETAMINOPHEN 325 MG PO TABS: 650.0000 mg | ORAL_TABLET | ORAL | Status: DC | PRN

## 2016-04-12 MED ORDER — VANCOMYCIN HCL 1000 MG IV SOLR
INTRAVENOUS | Status: AC
  Filled 2016-04-12: qty 1000

## 2016-04-12 MED ORDER — MIDAZOLAM HCL 2 MG/2ML IJ SOLN
INTRAMUSCULAR | Status: AC
  Filled 2016-04-12: qty 2

## 2016-04-12 MED ORDER — GLYCOPYRROLATE 0.2 MG/ML IV SOSY
PREFILLED_SYRINGE | INTRAVENOUS | Status: AC
  Filled 2016-04-12: qty 3

## 2016-04-12 MED ORDER — DEXAMETHASONE SODIUM PHOSPHATE 10 MG/ML IJ SOLN
INTRAMUSCULAR | Status: AC
  Filled 2016-04-12: qty 1

## 2016-04-12 MED ORDER — GABAPENTIN 300 MG PO CAPS
300.0000 mg | ORAL_CAPSULE | Freq: Two times a day (BID) | ORAL | Status: DC
  Administered 2016-04-12 – 2016-04-13 (×3): 300 mg via ORAL
  Filled 2016-04-12 (×3): qty 1

## 2016-04-12 MED ORDER — NEOSTIGMINE METHYLSULFATE 10 MG/10ML IV SOLN
INTRAVENOUS | Status: DC | PRN
  Administered 2016-04-12: 5 mg via INTRAVENOUS

## 2016-04-12 MED ORDER — PANTOPRAZOLE SODIUM 40 MG PO TBEC
80.0000 mg | DELAYED_RELEASE_TABLET | Freq: Every day | ORAL | Status: DC
  Administered 2016-04-12 – 2016-04-13 (×2): 80 mg via ORAL
  Filled 2016-04-12 (×2): qty 2

## 2016-04-12 MED ORDER — SODIUM CHLORIDE 0.9 % IV SOLN
250.0000 mL | INTRAVENOUS | Status: DC
Start: 2016-04-12 — End: 2016-04-14

## 2016-04-12 MED ORDER — METHOCARBAMOL 500 MG PO TABS
ORAL_TABLET | ORAL | Status: AC
  Filled 2016-04-12: qty 1

## 2016-04-12 MED ORDER — LACTATED RINGERS IV SOLN
INTRAVENOUS | Status: DC
  Administered 2016-04-12 (×4): via INTRAVENOUS

## 2016-04-12 MED ORDER — SIMVASTATIN 20 MG PO TABS
20.0000 mg | ORAL_TABLET | Freq: Every day | ORAL | Status: DC
  Administered 2016-04-12 – 2016-04-13 (×2): 20 mg via ORAL
  Filled 2016-04-12 (×2): qty 1

## 2016-04-12 MED ORDER — MIDAZOLAM HCL 5 MG/5ML IJ SOLN
INTRAMUSCULAR | Status: DC | PRN
  Administered 2016-04-12: 2 mg via INTRAVENOUS

## 2016-04-12 MED ORDER — LIDOCAINE HCL (CARDIAC) 20 MG/ML IV SOLN
INTRAVENOUS | Status: DC | PRN
  Administered 2016-04-12: 60 mg via INTRAVENOUS

## 2016-04-12 MED ORDER — OXYCODONE-ACETAMINOPHEN 5-325 MG PO TABS
ORAL_TABLET | ORAL | Status: AC
  Filled 2016-04-12: qty 2

## 2016-04-12 MED ORDER — DULOXETINE HCL 30 MG PO CPEP
60.0000 mg | ORAL_CAPSULE | Freq: Every day | ORAL | Status: DC
  Administered 2016-04-12 – 2016-04-13 (×2): 60 mg via ORAL
  Filled 2016-04-12 (×2): qty 2

## 2016-04-12 MED ORDER — HYDROMORPHONE HCL 1 MG/ML IJ SOLN
INTRAMUSCULAR | Status: AC
  Filled 2016-04-12: qty 1

## 2016-04-12 MED ORDER — ONDANSETRON HCL 4 MG/2ML IJ SOLN
INTRAMUSCULAR | Status: AC
  Filled 2016-04-12: qty 2

## 2016-04-12 MED ORDER — SODIUM CHLORIDE 0.9% FLUSH: 3.0000 mL | INTRAVENOUS | Status: DC | PRN

## 2016-04-12 MED ORDER — BISACODYL 10 MG RE SUPP: 10.0000 mg | Freq: Every day | RECTAL | Status: DC | PRN

## 2016-04-12 MED ORDER — HYDROMORPHONE HCL 1 MG/ML IJ SOLN
0.5000 mg | INTRAMUSCULAR | Status: DC | PRN
  Administered 2016-04-13 (×2): 1 mg via INTRAVENOUS
  Filled 2016-04-12 (×2): qty 1

## 2016-04-12 MED ORDER — GLYCOPYRROLATE 0.2 MG/ML IJ SOLN
INTRAMUSCULAR | Status: DC | PRN
  Administered 2016-04-12: 0.6 mg via INTRAVENOUS

## 2016-04-12 MED ORDER — ADULT MULTIVITAMIN W/MINERALS CH
1.0000 | ORAL_TABLET | Freq: Every day | ORAL | Status: DC
  Administered 2016-04-12 – 2016-04-13 (×2): 1 via ORAL
  Filled 2016-04-12 (×2): qty 1

## 2016-04-12 MED ORDER — FENTANYL CITRATE (PF) 100 MCG/2ML IJ SOLN
INTRAMUSCULAR | Status: DC | PRN
  Administered 2016-04-12: 50 ug via INTRAVENOUS
  Administered 2016-04-12: 100 ug via INTRAVENOUS
  Administered 2016-04-12: 50 ug via INTRAVENOUS
  Administered 2016-04-12 (×2): 100 ug via INTRAVENOUS
  Administered 2016-04-12: 50 ug via INTRAVENOUS
  Administered 2016-04-12: 100 ug via INTRAVENOUS
  Administered 2016-04-12 (×4): 50 ug via INTRAVENOUS

## 2016-04-12 MED ORDER — DOCUSATE SODIUM 100 MG PO CAPS
100.0000 mg | ORAL_CAPSULE | Freq: Two times a day (BID) | ORAL | Status: DC
  Administered 2016-04-12 – 2016-04-13 (×3): 100 mg via ORAL
  Filled 2016-04-12 (×3): qty 1

## 2016-04-12 MED ORDER — ACETAMINOPHEN 650 MG RE SUPP: 650.0000 mg | RECTAL | Status: DC | PRN

## 2016-04-12 MED ORDER — BUPIVACAINE HCL (PF) 0.5 % IJ SOLN
INTRAMUSCULAR | Status: DC | PRN
  Administered 2016-04-12: 9 mL

## 2016-04-12 MED ORDER — ARTIFICIAL TEARS OP OINT
TOPICAL_OINTMENT | OPHTHALMIC | Status: DC | PRN
  Administered 2016-04-12: 1 via OPHTHALMIC

## 2016-04-12 MED ORDER — PROPOFOL 10 MG/ML IV BOLUS
INTRAVENOUS | Status: AC
  Filled 2016-04-12: qty 20

## 2016-04-12 MED ORDER — FLEET ENEMA 7-19 GM/118ML RE ENEM: 1.0000 | ENEMA | Freq: Once | RECTAL | Status: DC | PRN

## 2016-04-12 MED ORDER — BUPIVACAINE LIPOSOME 1.3 % IJ SUSP
INTRAMUSCULAR | Status: DC | PRN
  Administered 2016-04-12: 20 mL

## 2016-04-12 MED ORDER — DEXAMETHASONE SODIUM PHOSPHATE 10 MG/ML IJ SOLN
INTRAMUSCULAR | Status: DC | PRN
  Administered 2016-04-12: 10 mg via INTRAVENOUS

## 2016-04-12 MED ORDER — GLUCOSAMINE HCL-MSM 1500-500 MG/30ML PO LIQD: Freq: Two times a day (BID) | ORAL | Status: DC

## 2016-04-12 MED ORDER — HYDROCHLOROTHIAZIDE 12.5 MG PO CAPS
12.5000 mg | ORAL_CAPSULE | Freq: Every day | ORAL | Status: DC
  Administered 2016-04-12 – 2016-04-13 (×2): 12.5 mg via ORAL
  Filled 2016-04-12 (×2): qty 1

## 2016-04-12 MED ORDER — MEPERIDINE HCL 25 MG/ML IJ SOLN: 6.2500 mg | INTRAMUSCULAR | Status: DC | PRN

## 2016-04-12 MED ORDER — PROPOFOL 10 MG/ML IV BOLUS
INTRAVENOUS | Status: DC | PRN
  Administered 2016-04-12: 200 mg via INTRAVENOUS

## 2016-04-12 MED ORDER — LACTATED RINGERS IV SOLN: INTRAVENOUS | Status: DC

## 2016-04-12 MED ORDER — SODIUM CHLORIDE 0.9 % IR SOLN
Status: DC | PRN
  Administered 2016-04-12: 14:00:00

## 2016-04-12 MED ORDER — ALBUMIN HUMAN 5 % IV SOLN
INTRAVENOUS | Status: DC | PRN
  Administered 2016-04-12: 16:00:00 via INTRAVENOUS

## 2016-04-12 MED ORDER — THROMBIN 20000 UNITS EX SOLR
CUTANEOUS | Status: DC | PRN
  Administered 2016-04-12: 14:00:00 via TOPICAL

## 2016-04-12 MED ORDER — HYDROMORPHONE HCL 1 MG/ML IJ SOLN
0.2500 mg | INTRAMUSCULAR | Status: DC | PRN
  Administered 2016-04-12 (×4): 0.5 mg via INTRAVENOUS

## 2016-04-12 MED ORDER — PHENOL 1.4 % MT LIQD: 1.0000 | OROMUCOSAL | Status: DC | PRN

## 2016-04-12 MED ORDER — ROCURONIUM BROMIDE 100 MG/10ML IV SOLN
INTRAVENOUS | Status: DC | PRN
  Administered 2016-04-12 (×3): 50 mg via INTRAVENOUS

## 2016-04-12 MED ORDER — FLUTICASONE PROPIONATE 50 MCG/ACT NA SUSP
1.0000 | Freq: Every day | NASAL | Status: DC | PRN
  Filled 2016-04-12: qty 16

## 2016-04-12 SURGICAL SUPPLY — 71 items
BAG DECANTER FOR FLEXI CONT (MISCELLANEOUS) ×3 IMPLANT
BENZOIN TINCTURE PRP APPL 2/3 (GAUZE/BANDAGES/DRESSINGS) IMPLANT
BIT DRILL 3.5 POWEREASE (BIT) ×2 IMPLANT
BIT DRILL 3.5MM POWEREASE (BIT) ×1
BLADE CLIPPER SURG (BLADE) IMPLANT
BLADE SURG 11 STRL SS (BLADE) ×3 IMPLANT
BUR MATCHSTICK NEURO 3.0 LAGG (BURR) ×3 IMPLANT
BUR PRECISION FLUTE 5.0 (BURR) ×3 IMPLANT
CANISTER SUCT 3000ML PPV (MISCELLANEOUS) ×3 IMPLANT
CLOSURE WOUND 1/2 X4 (GAUZE/BANDAGES/DRESSINGS)
CONT SPEC 4OZ CLIKSEAL STRL BL (MISCELLANEOUS) ×3 IMPLANT
COVER BACK TABLE 60X90IN (DRAPES) ×3 IMPLANT
DECANTER SPIKE VIAL GLASS SM (MISCELLANEOUS) ×3 IMPLANT
DEVICE INTERBODY ELEVATE 23X7 (Cage) ×6 IMPLANT
DRAPE C-ARM 42X72 X-RAY (DRAPES) ×3 IMPLANT
DRAPE C-ARMOR (DRAPES) ×3 IMPLANT
DRAPE LAPAROTOMY 100X72X124 (DRAPES) ×3 IMPLANT
DRAPE POUCH INSTRU U-SHP 10X18 (DRAPES) ×3 IMPLANT
DRAPE PROXIMA HALF (DRAPES) ×3 IMPLANT
DRAPE SURG 17X23 STRL (DRAPES) ×3 IMPLANT
DRSG OPSITE POSTOP 4X6 (GAUZE/BANDAGES/DRESSINGS) ×3 IMPLANT
DURAPREP 26ML APPLICATOR (WOUND CARE) ×3 IMPLANT
ELECT REM PT RETURN 9FT ADLT (ELECTROSURGICAL) ×3
ELECTRODE REM PT RTRN 9FT ADLT (ELECTROSURGICAL) ×1 IMPLANT
GAUZE SPONGE 4X4 12PLY STRL (GAUZE/BANDAGES/DRESSINGS) IMPLANT
GAUZE SPONGE 4X4 16PLY XRAY LF (GAUZE/BANDAGES/DRESSINGS) IMPLANT
GLOVE BIOGEL PI IND STRL 7.5 (GLOVE) ×1 IMPLANT
GLOVE BIOGEL PI INDICATOR 7.5 (GLOVE) ×2
GLOVE ECLIPSE 7.0 STRL STRAW (GLOVE) ×3 IMPLANT
GLOVE ECLIPSE 9.0 STRL (GLOVE) ×3 IMPLANT
GLOVE EXAM NITRILE LRG STRL (GLOVE) IMPLANT
GLOVE EXAM NITRILE MD LF STRL (GLOVE) IMPLANT
GLOVE EXAM NITRILE XL STR (GLOVE) IMPLANT
GLOVE EXAM NITRILE XS STR PU (GLOVE) IMPLANT
GOWN STRL REUS W/ TWL LRG LVL3 (GOWN DISPOSABLE) ×2 IMPLANT
GOWN STRL REUS W/ TWL XL LVL3 (GOWN DISPOSABLE) IMPLANT
GOWN STRL REUS W/TWL 2XL LVL3 (GOWN DISPOSABLE) IMPLANT
GOWN STRL REUS W/TWL LRG LVL3 (GOWN DISPOSABLE) ×4
GOWN STRL REUS W/TWL XL LVL3 (GOWN DISPOSABLE)
HEMOSTAT POWDER KIT SURGIFOAM (HEMOSTASIS) ×3 IMPLANT
KIT BASIN OR (CUSTOM PROCEDURE TRAY) ×3 IMPLANT
KIT INFUSE X SMALL 1.4CC (Orthopedic Implant) ×3 IMPLANT
KIT ROOM TURNOVER OR (KITS) ×3 IMPLANT
LIQUID BAND (GAUZE/BANDAGES/DRESSINGS) ×3 IMPLANT
MILL MEDIUM DISP (BLADE) ×3 IMPLANT
NEEDLE HYPO 18GX1.5 BLUNT FILL (NEEDLE) IMPLANT
NEEDLE HYPO 21X1.5 SAFETY (NEEDLE) ×3 IMPLANT
NEEDLE HYPO 25X1 1.5 SAFETY (NEEDLE) ×3 IMPLANT
NEEDLE SPNL 18GX3.5 QUINCKE PK (NEEDLE) IMPLANT
NS IRRIG 1000ML POUR BTL (IV SOLUTION) ×3 IMPLANT
PACK LAMINECTOMY NEURO (CUSTOM PROCEDURE TRAY) ×3 IMPLANT
PAD ARMBOARD 7.5X6 YLW CONV (MISCELLANEOUS) ×9 IMPLANT
ROD COBALT 47.5X35 (Rod) ×6 IMPLANT
SCREW 5.5X35MM (Screw) ×8 IMPLANT
SCREW BN 35X5.5XMA NS SPNE (Screw) ×4 IMPLANT
SCREW SET SOLERA (Screw) ×8 IMPLANT
SCREW SET SOLERA TI (Screw) ×4 IMPLANT
SPONGE LAP 4X18 X RAY DECT (DISPOSABLE) IMPLANT
SPONGE SURGIFOAM ABS GEL 100 (HEMOSTASIS) ×3 IMPLANT
STRIP BIOACTIVE VITOSS 25X100X (Neuro Prosthesis/Implant) ×3 IMPLANT
STRIP CLOSURE SKIN 1/2X4 (GAUZE/BANDAGES/DRESSINGS) IMPLANT
SUT VIC AB 0 CT1 18XCR BRD8 (SUTURE) ×2 IMPLANT
SUT VIC AB 0 CT1 8-18 (SUTURE) ×4
SUT VICRYL 3-0 RB1 18 ABS (SUTURE) ×6 IMPLANT
SYR 20CC LL (SYRINGE) ×3 IMPLANT
SYR 3ML LL SCALE MARK (SYRINGE) IMPLANT
TOWEL OR 17X24 6PK STRL BLUE (TOWEL DISPOSABLE) ×3 IMPLANT
TOWEL OR 17X26 10 PK STRL BLUE (TOWEL DISPOSABLE) ×3 IMPLANT
TRAP SPECIMEN MUCOUS 40CC (MISCELLANEOUS) ×3 IMPLANT
TRAY FOLEY W/METER SILVER 16FR (SET/KITS/TRAYS/PACK) ×3 IMPLANT
WATER STERILE IRR 1000ML POUR (IV SOLUTION) ×3 IMPLANT

## 2016-04-12 NOTE — Anesthesia Preprocedure Evaluation (Addendum)
Anesthesia Evaluation  Patient identified by MRN, date of birth, ID band Patient awake    Reviewed: Allergy & Precautions, NPO status , Patient's Chart, lab work & pertinent test results  Airway Mallampati: II  TM Distance: >3 FB Neck ROM: Full    Dental  (+) Dental Advisory Given, Poor Dentition, Missing,    Pulmonary sleep apnea , former smoker,    breath sounds clear to auscultation       Cardiovascular hypertension, Pt. on medications  Rhythm:Regular Rate:Normal     Neuro/Psych negative neurological ROS  negative psych ROS   GI/Hepatic Neg liver ROS, hiatal hernia, GERD  Medicated,  Endo/Other  negative endocrine ROS  Renal/GU negative Renal ROS  negative genitourinary   Musculoskeletal  (+) Arthritis , Osteoarthritis,    Abdominal (+) + obese,   Peds negative pediatric ROS (+)  Hematology   Anesthesia Other Findings   Reproductive/Obstetrics negative OB ROS                            Lab Results  Component Value Date   WBC 7.6 04/12/2016   HGB 15.3 04/12/2016   HCT 44.7 04/12/2016   MCV 92.0 04/12/2016   PLT 248 04/12/2016   Lab Results  Component Value Date   CREATININE 0.87 03/25/2016   BUN 16 03/25/2016   NA 137 03/25/2016   K 4.1 03/25/2016   CL 103 03/25/2016   CO2 27 03/25/2016   Lab Results  Component Value Date   INR 0.99 03/15/2010   INR 1.2 04/16/2009   INR 1.1 04/15/2009   03/2016 EKG: normal sinus rhythm.   Anesthesia Physical Anesthesia Plan  ASA: III  Anesthesia Plan: General   Post-op Pain Management:    Induction: Intravenous and Rapid sequence  Airway Management Planned: Oral ETT  Additional Equipment:   Intra-op Plan:   Post-operative Plan: Extubation in OR  Informed Consent: I have reviewed the patients History and Physical, chart, labs and discussed the procedure including the risks, benefits and alternatives for the proposed  anesthesia with the patient or authorized representative who has indicated his/her understanding and acceptance.     Plan Discussed with: CRNA  Anesthesia Plan Comments: (Modified RSI secondary to anticipated difficultly with mask ventilation due to beard. )       Anesthesia Quick Evaluation

## 2016-04-12 NOTE — Progress Notes (Signed)
Pharmacy Antibiotic Note  [name] is a 60 y.o. male admitted on 04/12/2016 with surgical prophylaxis.  Pharmacy has been consulted for vancomycin dosing. Pt is to received vancomycin 12h post-op for one dose since no drain is in place.  Plan: Vancomycin 1500mg  IV once Pharmacy will sign off  Weight: (!) 350 lb (158.8 kg)  Temp (24hrs), Avg:98.4 F (36.9 C), Min:98.1 F (36.7 C), Max:98.6 F (37 C)   Recent Labs Lab 04/12/16 1019  WBC 7.6  CREATININE 1.04    Estimated Creatinine Clearance: 119.1 mL/min (by C-G formula based on SCr of 1.04 mg/dL).    Allergies  Allergen Reactions  . Penicillins Swelling    SWELLING OF FACE (when patient was a small child)  Has patient had a PCN reaction causing immediate rash, facial/tongue/throat swelling, SOB or lightheadedness with hypotension: YES PCN reaction causing severe rash involving mucus membranes or skin necrosis:NO PCN reaction that required hospitalization NO PCN reaction occurring within the last 10 years: NO   . Iohexol Rash    During cardiac cath; resolved with Benadryl     Arlean Hopping. Newman Pies, PharmD, BCPS Clinical Pharmacist Pager [phone] 04/12/2016 7:15 PM

## 2016-04-12 NOTE — Progress Notes (Signed)
PHARMACIST - PHYSICIAN ORDER COMMUNICATION  CONCERNING: P&T Medication Policy on Herbal Medications  DESCRIPTION:  This patient's order for: Glucosamine has been noted.  This product(s) is classified as an "herbal" or natural product. Due to a lack of definitive safety studies or FDA approval, nonstandard manufacturing practices, plus the potential risk of unknown drug-drug interactions while on inpatient medications, the Pharmacy and Therapeutics Committee does not permit the use of "herbal" or natural products of this type within [facility].   ACTION TAKEN: The pharmacy department is unable to verify this order at this time and your patient has been informed of this safety policy. Please reevaluate patient's clinical condition at discharge and address if the herbal or natural product(s) should be resumed at that time.  Louie Casa, PharmD, BCPS 04/12/2016, 7:18 PM

## 2016-04-12 NOTE — Op Note (Signed)
PREOP DIAGNOSIS:  1. Lumbago 2. Lumbar stenosis, L5-S1 3. Spondylolisthesis, L5-S1 4. Degenerative Disc Disease, L5-S1  POSTOP DIAGNOSIS: Same  PROCEDURE: 1. L5-S1 laminectomy with facetectomy for decompression of exiting nerve roots, more than would be required for placement of interbody graft 2. Placement of anterior interbody device - Medtronic 7mm Expandable cage x2 3. Posterior instrumentation using cortical pedicle screws at L5 - S1 - Medtronic Solera 5.5 x 35mm cortical screws x4 4. Interbody arthrodesis, L5-S1 5. Use of locally harvested bone autograft 6. Use of non-structural bone allograft - BMP, Vitoss BA  SURGEON: Dr. Lisbeth Renshaw, MD  ASSISTANT: Dr. Curt Bears, MD  ANESTHESIA: General Endotracheal  EBL: 275cc  SPECIMENS: None  DRAINS: None  COMPLICATIONS: None immediate  CONDITION: Hemodynamically stable to PACU  HISTORY: [name] is a 60 y.o. male who has been followed in the outpatient clinic with back and primarily left leg pain related to spondylosis, stenosis, and spondylolisthesis at L5-S1. He attempted multiple conservative treatments and ultimately elected to proceed with surgical decompression and fusion. Risks and benefits were reviewed and consent was obtained.  PROCEDURE IN DETAIL: After informed consent was obtained and witnessed, the patient was brought to the operating room. After induction of general anesthesia, the patient was positioned on the operative table in the prone position. All pressure points were meticulously padded. Incision was then marked out and prepped and draped in the usual sterile fashion.  After timeout was conducted, skin was infiltrated with local anesthetic. Skin incision was then made sharply and Bovie electrocautery was used to dissect the subcutaneous tissue until the lumbodorsal fascia was identified and incised. The muscle was then elevated in the subperiosteal plane and the L5 and S1 lamina, inferior L4-5 and  the L5-S1 facet complexes were identified. The laminotomy defect on the left at L4-5 and L5-S1 was identified and care was taken to preserve the dura in the defect. Self-retaining retractors were then placed. Fluoroscopy was used to confirm location at the correct level.  Using intraoperative fluoroscopy, entry points for bilateral L5 cortical pedicle screws were identified. Pilot holes were then created under lateral fluoroscopy and tapped to 5.5 x 35 mm.  At this point attention was turned to decompression. Complete L5 laminectomy with facetectomy was completed using a combination of Kerrison rongeurs and a high-speed drill. The L5 vertebra was noted to be transitional with significantly lateralized pedicles. Good decompression of the exiting and traversing roots was confirmed.  Disc space was then identified, incised, and using a combination of shavers, curettes and rongeurs, complete discectomy was completed. There was significant posterior osteophytosis of the L5 endplate, which was adherent to the left S1 nerve root. The left S1 root was also noted to be significantly larger than the left. Shavers, currettes, and rongeurs were used to take down the osteophytes. Endplates were prepared, and 7mm expandable cages were tapped into place bilaterally.  Bone harvested during decompression was mixed with Vitoss and packed into the interspace. Good position was confirmed with fluoroscopy.  At this point, the S1 cortical pedicle screws were drilled and tapped to 5.5 mm. Screws were then placed in L5 and S1. Rod was then placed, set screws placed and final tightened. Final AP and lateral fluoroscopic images confirmed good position.  The wound was then irrigated with copious amounts of antibiotic saline, then closed in standard fashion using a combination of interrupted 0 and 3-0 Vicryl stitches in the muscular, fascial, and subcutaneous layers. Skin was then closed using standard Dermabond.  Sterile dressing was  then applied. The patient was then transferred to the stretcher, extubated, and taken to the postanesthesia care unit in stable hemodynamic condition.  At the end of the case all sponge, needle, cottonoid, and instrument counts were correct.

## 2016-04-12 NOTE — H&P (Signed)
CC:  No chief complaint on file.   HPI: Mr. Bialas is a 60 year old male who has previously seen Dr. Gerlene Fee who has since retired. Briefly, he has been dealing with back and primarily left sided leg pain for many years. He has previously undergone 2 surgeries at L5-S1, and one surgery at L4 L5. He describes relatively constant left-sided leg pain, described as sharp, and stabbing in nature. He also has tightness, and grabbing pain in his lower back, which he says is worse when he is getting up in the morning. In the past, he has been through a course of physical therapy, as well as multiple what sound like epidural steroid injections by Dr. Ethelene Hal. He has been on meloxicam, Cymbalta, and Neurontin for this pain. He was last seen in our office by Dr. Gerlene Fee about 5 months ago, at which time they were considering multilevel lumbar fusion. In the interim, the patient states that his symptoms have simply continue to progress, and they are nearly unbearable at this time   PMH: Past Medical History:  Diagnosis Date  . 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 Arthritis   . Constipation due to pain medication   . GERD (gastroesophageal reflux disease)   . H/O staphylococcal infection   . History of hiatal hernia   . Hypertension   . Neuropathy (HCC)   . Sleep apnea     PSH: Past Surgical History:  Procedure Laterality Date  . ANTERIOR CRUCIATE LIGAMENT REPAIR     right  . arthroscopy     right knee  . BACK SURGERY     x 3  . CARDIAC CATHETERIZATION  2005   normal per pt.  . COLONOSCOPY WITH PROPOFOL N/A 02/21/2014   Procedure: COLONOSCOPY WITH PROPOFOL;  Surgeon: Theda Belfast, MD;  Location: [facility];  Service: Endoscopy;  Laterality: N/A;  . JOINT REPLACEMENT     right knee  . MENISCUS REPAIR Left    knee    SH: Social History  Substance Use Topics  . Smoking status: Former Smoker    Types: Cigarettes    Quit date: 02/29/2004  . Smokeless tobacco: Never Used  .  Alcohol use Yes     Comment: occasionally    MEDS: Prior to Admission medications   Medication Sig Start Date End Date Taking? Authorizing Provider  aspirin 325 MG EC tablet Take 325 mg by mouth every morning.   Yes Historical Provider, MD  cephALEXin (KEFLEX) 500 MG capsule Take 500 mg by mouth 2 (two) times daily. Take every day to prevent return of staff in infection   Yes Historical Provider, MD  DULoxetine (CYMBALTA) 60 MG capsule Take 60 mg by mouth daily.   Yes Historical Provider, MD  fluticasone (FLONASE) 50 MCG/ACT nasal spray Place 1 spray into both nostrils daily as needed for allergies.    Yes Historical Provider, MD  gabapentin (NEURONTIN) 300 MG capsule Take 300 mg by mouth 2 (two) times daily.   Yes Historical Provider, MD  GLUCOSAMINE HCL-MSM PO Take 1,500 mg by mouth 2 (two) times daily.   Yes Historical Provider, MD  HYDROcodone-acetaminophen (NORCO) 7.5-325 MG per tablet Take 2 tablets by mouth 3 (three) times daily as needed for moderate pain.    Yes Historical Provider, MD  lisinopril-hydrochlorothiazide (PRINZIDE,ZESTORETIC) 20-12.5 MG per tablet Take 1 tablet by mouth every morning.   Yes Historical Provider, MD  meloxicam (MOBIC) 15 MG tablet Take 15 mg by mouth daily.  Yes Historical Provider, MD  Multiple Vitamin (MULTIVITAMIN WITH MINERALS) TABS tablet Take 1 tablet by mouth daily.   Yes Historical Provider, MD  omeprazole (PRILOSEC) 40 MG capsule Take 40 mg by mouth 2 (two) times daily.    Yes Historical Provider, MD  simvastatin (ZOCOR) 20 MG tablet Take 20 mg by mouth at bedtime.   Yes Historical Provider, MD    ALLERGY: Allergies  Allergen Reactions  . Penicillins Swelling    SWELLING OF FACE (when patient was a small child)  Has patient had a PCN reaction causing immediate rash, facial/tongue/throat swelling, SOB or lightheadedness with hypotension: YES PCN reaction causing severe rash involving mucus membranes or skin necrosis:NO PCN reaction that  required hospitalization NO PCN reaction occurring within the last 10 years: NO   . Iohexol Rash    During cardiac cath; resolved with Benadryl    ROS: ROS  NEUROLOGIC EXAM: Awake, alert, oriented Memory and concentration grossly intact Speech fluent, appropriate CN grossly intact Motor exam: Upper Extremities Deltoid Bicep Tricep Grip  Right 5/5 5/5 5/5 5/5  Left 5/5 5/5 5/5 5/5   Lower Extremity IP Quad PF DF EHL  Right 5/5 5/5 5/5 5/5 5/5  Left 5/5 5/5 5/5 5/5 5/5   Sensation grossly intact to LT  IMGAING: MRI of the lumbar spine was reviewed. It appears primary pathology is at L5-S1, where there is grade 1 retrolisthesis. There is a left eccentric disc herniation, with left lateral recess and foraminal stenosis. The left S1 nerve root appears enlarged. At L4 L5, there is slight disc desiccation. There is bilateral facet arthropathy, although there is minimal stenosis. At L3 L4, there is grade 1 retrolisthesis. There is broad-based disc bulge, with mild central, and mild to moderate lateral recess stenosis.   IMPRESSION: 60 year old man with chronic back and left-sided leg pain. Although he does have multilevel lumbar spondylosis, I suspect the majority of his symptoms are related to the L5-S1 level. He has failed multiple different conservative treatments in the past.   PLAN: Plan on proceeding with L5-S1 decompression and posterior lumbar interbody fusion   I have reviewed the imaging findings again with the patient in the office. While previous discussions with the patient included the possibility of multilevel lumbar fusion, I did explain to him that I believe his symptoms are more likely related to L5-S1, and then a single level fusion carried lower risk than a 3 level fusion. We did however discuss the possibility of adjacent segment disease, and the possible need for future extension of fusion. I explained to the patient the details of the procedure, as well as the  risks which include but are not limited to nerve root injury leading to leg or foot weakness and or bowel and bladder dysfunction, CSF leak, bleeding, and infection. Possible outcomes of surgery were also discussed including the possibility of persistence of pain symptoms. The general risks of anesthesia were also reviewed including heart attack, stroke, and DVT/PE.   The patient understood our discussion and is willing to proceed with surgical decompression and fusion. All questions were answered

## 2016-04-12 NOTE — Transfer of Care (Signed)
Immediate Anesthesia Transfer of Care Note  Patient: [name]  Procedure(s) Performed: Procedure(s): Posterior Lumbar Interbody Fusion - Lumbar five-Sacral one (N/A)  Patient Location: PACU  Anesthesia Type:General  Level of Consciousness: awake, alert , oriented and patient cooperative  Airway & Oxygen Therapy: Patient Spontanous Breathing and Patient connected to nasal cannula oxygen  Post-op Assessment: Report given to RN and Post -op Vital signs reviewed and stable  Post vital signs: Reviewed and stable  Last Vitals:  Vitals:   04/12/16 0953 04/12/16 1730  BP: (!) 165/86   Pulse: 100   Resp: 20   Temp: 37 C 36.7 C    Last Pain:  Vitals:   04/12/16 0953  TempSrc: Oral         Complications: No apparent anesthesia complications

## 2016-04-13 LAB — CBC
HCT: 39.3 % (ref 39.0–52.0)
Hemoglobin: 13.2 g/dL (ref 13.0–17.0)
MCH: 31.1 pg (ref 26.0–34.0)
MCHC: 33.6 g/dL (ref 30.0–36.0)
MCV: 92.5 fL (ref 78.0–100.0)
PLATELETS: 252 10*3/uL (ref 150–400)
RBC: 4.25 MIL/uL (ref 4.22–5.81)
RDW: 13.2 % (ref 11.5–15.5)
WBC: 16.6 10*3/uL — AB (ref 4.0–10.5)

## 2016-04-13 LAB — BASIC METABOLIC PANEL
ANION GAP: 9 (ref 5–15)
BUN: 13 mg/dL (ref 6–20)
CO2: 22 mmol/L (ref 22–32)
Calcium: 9.3 mg/dL (ref 8.9–10.3)
Chloride: 100 mmol/L — ABNORMAL LOW (ref 101–111)
Creatinine, Ser: 1.08 mg/dL (ref 0.61–1.24)
Glucose, Bld: 148 mg/dL — ABNORMAL HIGH (ref 65–99)
POTASSIUM: 3.8 mmol/L (ref 3.5–5.1)
SODIUM: 131 mmol/L — AB (ref 135–145)

## 2016-04-13 MED ORDER — CHLORHEXIDINE GLUCONATE CLOTH 2 % EX PADS
6.0000 | MEDICATED_PAD | Freq: Every day | CUTANEOUS | Status: DC
  Administered 2016-04-13 – 2016-04-14 (×2): 6 via TOPICAL

## 2016-04-13 MED ORDER — OXYCODONE-ACETAMINOPHEN 5-325 MG PO TABS
1.0000 | ORAL_TABLET | ORAL | Status: DC | PRN
  Administered 2016-04-13 – 2016-04-14 (×4): 1 via ORAL
  Filled 2016-04-13 (×4): qty 1

## 2016-04-13 MED ORDER — OXYCODONE HCL 5 MG PO TABS
10.0000 mg | ORAL_TABLET | ORAL | Status: DC | PRN
  Administered 2016-04-13 – 2016-04-14 (×4): 10 mg via ORAL
  Filled 2016-04-13 (×4): qty 2

## 2016-04-13 NOTE — Progress Notes (Signed)
No issues overnight. Pt does c/o back pain. Still has some pain running down left leg. Walking well with PT.  EXAM:  BP (!) 96/44 (BP Location: Right Arm)   Pulse (!) 118   Temp 98 F (36.7 C) (Oral)   Resp 20   Wt (!) 158.8 kg (350 lb)   SpO2 97%   BMI 47.47 kg/m   Awake, alert, oriented  Speech fluent, appropriate  CN grossly intact  5/5 BUE/BLE   IMPRESSION:  60 y.o. male POD#1 s/p L5-S1 PLIF. At baseline  PLAN: - Will increase to 15mg  Oxycodone initially postop - Plan on d/c tomorrow am.

## 2016-04-13 NOTE — Progress Notes (Signed)
RT went to assess patient and CPAP. Patient has his home unit. Patient needs no assistance in putting on mask.

## 2016-04-13 NOTE — Progress Notes (Signed)
Patient home CPAP and doesn't need assistance with the application of his mask.

## 2016-04-13 NOTE — Evaluation (Signed)
Occupational Therapy Evaluation Patient Details Name: [name] MRN: [id number] DOB: 1956/04/15 Today's Date: 04/13/2016    History of Present Illness 60 y.o. male admitted to [facility] on 04/12/16 for elective L5-S1 decompression and fusion.  Pt with significant PMHx of R TKA, HTN, anemia, staph infection, L knee surgery (scope), and multiple back surgeries.     Clinical Impression   Patient evaluated by Occupational Therapy with no further acute OT needs identified. All education has been completed and the patient has no further questions. See below for any follow-up Occupational Therapy or equipment needs. OT to sign off. Thank you for referral.      Follow Up Recommendations  No OT follow up    Equipment Recommendations  None recommended by OT    Recommendations for Other Services       Precautions / Restrictions Precautions Precautions: Back Precaution Booklet Issued: Yes (comment) Precaution Comments: handout provided and reviewed in detail Required Braces or Orthoses: Spinal Brace Spinal Brace: Lumbar corset;Applied in sitting position Restrictions Weight Bearing Restrictions: No      Mobility Bed Mobility Overal bed mobility: Modified Independent Bed Mobility: Rolling;Sidelying to Sit;Sit to Sidelying Rolling: Supervision Sidelying to sit: Supervision     Sit to sidelying: Mod assist General bed mobility comments: cues for sequence but able to exit on R side  Transfers Overall transfer level: Needs assistance Equipment used: Rolling walker (2 wheeled);None Transfers: Sit to/from Stand Sit to Stand: Supervision         General transfer comment: Min guard assist for safety due to slow, stiff transition (especially to get to standing) over flexed knees.      Balance Overall balance assessment: Needs assistance Sitting-balance support: Bilateral upper extremity supported;Feet supported Sitting balance-Leahy Scale: Fair     Standing balance support:  No upper extremity supported;During functional activity Standing balance-Leahy Scale: Poor                              ADL Overall ADL's : Needs assistance/impaired Eating/Feeding: Independent Eating/Feeding Details (indicate cue type and reason): educated on items on R side to avoid twisting . Home setup to avoid breaking precautiosn                 Lower Body Dressing: Supervision/safety;Bed level Lower Body Dressing Details (indicate cue type and reason): able to cross bil LE to hook shorts. pt unable to doff socks. pt reports does not wear socks at home. pt uses slip on shoes Toilet Transfer: Supervision/safety         Tub/Shower Transfer Details (indicate cue type and reason): pt reports does not plan to use tub shower . pt reports having MRSA previous at the Knee and believes it was from showering at home. pt plans to sponge bath   General ADL Comments: All education complete for precautions with adls, don doff brace, bed positioning, heat /ice for neck soreness. Pt provided both and told to rotate after 15 minutes to help with soreness. pt will have son/ x2 sister (A) upon d/c      Vision     Perception     Praxis      Pertinent Vitals/Pain Pain Assessment: Faces Pain Score: 10-Worst pain ever ("Can I say higher than 10?") Faces Pain Scale: Hurts whole lot Pain Location: neck  Pain Descriptors / Indicators: Discomfort;Operative site guarding Pain Intervention(s): Monitored during session;Premedicated before session;Repositioned;Ice applied;Heat applied     Hand Dominance Right  Extremity/Trunk Assessment Upper Extremity Assessment Upper Extremity Assessment: Overall WFL for tasks assessed   Lower Extremity Assessment Lower Extremity Assessment: Defer to PT evaluation LLE Deficits / Details: left leg still has nerve pain and tingling/burning (per pt report worse than pre-op).    Cervical / Trunk Assessment Cervical / Trunk Assessment: Other  exceptions Cervical / Trunk Exceptions: s/p surg   Communication Communication Communication: No difficulties   Cognition Arousal/Alertness: Awake/alert Behavior During Therapy: WFL for tasks assessed/performed Overall Cognitive Status: Within Functional Limits for tasks assessed                     General Comments       Exercises       Shoulder Instructions      Home Living Family/patient expects to be discharged to:: Private residence Living Arrangements: Spouse/significant other;Children Available Help at Discharge: Family Type of Home: House Home Access: Stairs to enter Secretary/administrator of Steps: 2 Entrance Stairs-Rails: None Home Layout: One level     Bathroom Shower/Tub: Chief Strategy Officer: Standard     Home Equipment: None          Prior Functioning/Environment Level of Independence: Independent             OT Diagnosis: Generalized weakness   OT Problem List:     OT Treatment/Interventions:      OT Goals(Current goals can be found in the care plan section) Acute Rehab OT Goals Patient Stated Goal: to go home   OT Frequency:     Barriers to D/C:            Co-evaluation              End of Session Equipment Utilized During Treatment: Back brace Nurse Communication: Mobility status;Precautions  Activity Tolerance: Patient tolerated treatment well Patient left: in bed;with call bell/phone within reach   Time: 0853-0924 OT Time Calculation (min): 31 min Charges:  OT General Charges $OT Visit: 1 Procedure OT Evaluation $OT Eval Moderate Complexity: 1 Procedure OT Treatments $Self Care/Home Management : 8-22 mins G-Codes:    Boone Master B 2016-04-27, 11:44 AM   Mateo Flow   OTR/L Pager: [phone] Office: [phone] .

## 2016-04-13 NOTE — Evaluation (Signed)
Physical Therapy Evaluation Patient Details Name: [name] MRN: [id number] DOB: 04/25/1956 Today's Date: 04/13/2016   History of Present Illness  60 y.o. male admitted to [facility] on 04/12/16 for elective L5-S1 decompression and fusion.  Pt with significant PMHx of R TKA, HTN, anemia, staph infection, L knee surgery (scope), and multiple back surgeries.    Clinical Impression  Pt was able to walk down the hallway, did better with RW use as he was furniture walking during OT session.  Used RW as support to simulate home entry going up the stairs with no railings.  PT will follow acutely for activity progression and education, but I do not anticipate he will need f/u at discharge.   PT to follow acutely for deficits listed below.       Follow Up Recommendations No PT follow up    Equipment Recommendations  Rolling walker with 5" wheels;Other (comment) (mostly for safe home entry)    Recommendations for Other Services   NA    Precautions / Restrictions Precautions Precautions: Back Precaution Booklet Issued: Yes (comment) Precaution Comments: handout given and reviewed by OT, precautions reinforced during PT session.  Required Braces or Orthoses: Spinal Brace Spinal Brace: Lumbar corset;Applied in sitting position Restrictions Weight Bearing Restrictions: No      Mobility  Bed Mobility Overal bed mobility: Needs Assistance Bed Mobility: Rolling;Sidelying to Sit;Sit to Sidelying Rolling: Supervision Sidelying to sit: Supervision     Sit to sidelying: Mod assist General bed mobility comments: HOB flat and pt relying heavily on railing for support during transitions up and down.  Reinforced log roll technique and assist needed to help lift both legs back into the bed   Transfers Overall transfer level: Needs assistance Equipment used: Rolling walker (2 wheeled);None Transfers: Sit to/from Stand Sit to Stand: Min guard         General transfer comment: Min guard assist for  safety due to slow, stiff transition (especially to get to standing) over flexed knees.    Ambulation/Gait Ambulation/Gait assistance: Supervision Ambulation Distance (Feet): 250 Feet Assistive device: Rolling walker (2 wheeled) Gait Pattern/deviations: Step-through pattern;Trunk flexed Gait velocity: decreased Gait velocity interpretation: Below normal speed for age/gender General Gait Details: Verbal cues for upright posture and closer proximity to RW during gait to help maintain upright posture and light hands.    Stairs Stairs: Yes Stairs assistance: Min assist Stair Management: No rails;Step to pattern;Backwards;With walker Number of Stairs: 2 General stair comments: Simulated home entry with no stairs backwards with RW.  Pt reports his son will be there to help.  Verbal cues to lead up with his "good" leg and down with his weaker or "bad" leg.           Balance Overall balance assessment: Needs assistance Sitting-balance support: Bilateral upper extremity supported Sitting balance-Leahy Scale: Fair     Standing balance support: Bilateral upper extremity supported Standing balance-Leahy Scale: Poor                               Pertinent Vitals/Pain Pain Assessment: 0-10 Pain Score: 10-Worst pain ever ("Can I say higher than 10?") Pain Location: low back left leg Pain Descriptors / Indicators: Aching;Burning Pain Intervention(s): Limited activity within patient's tolerance;Monitored during session;Repositioned;Patient requesting pain meds-RN notified    Home Living Family/patient expects to be discharged to:: Private residence Living Arrangements: Spouse/significant other;Children Available Help at Discharge: Family Type of Home: House Home Access: Stairs  to enter Entrance Stairs-Rails: None Entrance Stairs-Number of Steps: 2 Home Layout: One level Home Equipment: None      Prior Function Level of Independence: Independent                   Extremity/Trunk Assessment   Upper Extremity Assessment: Defer to OT evaluation           Lower Extremity Assessment: Generalized weakness;LLE deficits/detail   LLE Deficits / Details: left leg still has nerve pain and tingling/burning (per pt report worse than pre-op).   Cervical / Trunk Assessment: Other exceptions  Communication   Communication: No difficulties  Cognition Arousal/Alertness: Awake/alert Behavior During Therapy: WFL for tasks assessed/performed Overall Cognitive Status: Within Functional Limits for tasks assessed                               Assessment/Plan    PT Assessment Patient needs continued PT services  PT Diagnosis Difficulty walking;Abnormality of gait;Generalized weakness;Acute pain   PT Problem List Decreased strength;Decreased activity tolerance;Decreased balance;Decreased mobility;Decreased knowledge of use of DME;Decreased knowledge of precautions;Obesity;Pain;Impaired sensation  PT Treatment Interventions DME instruction;Gait training;Functional mobility training;Stair training;Therapeutic activities;Therapeutic exercise;Balance training;Neuromuscular re-education;Patient/family education   PT Goals (Current goals can be found in the Care Plan section) Acute Rehab PT Goals Patient Stated Goal: to get his pain level down, have his left leg feel better PT Goal Formulation: With patient Time For Goal Achievement: 04/20/16 Potential to Achieve Goals: Good    Frequency Min 5X/week           End of Session Equipment Utilized During Treatment: Back brace Activity Tolerance: Patient limited by pain Patient left: in bed;with call bell/phone within reach Nurse Communication: Mobility status         Time: 0950-1016 PT Time Calculation (min) (ACUTE ONLY): 26 min   Charges:   PT Evaluation $PT Eval Moderate Complexity: 1 Procedure PT Treatments $Gait Training: 8-22 mins         Anakin Varkey B. Percy Winterrowd, PT, DPT [phone]    04/13/2016, 11:16 AM

## 2016-04-14 MED ORDER — OXYCODONE-ACETAMINOPHEN 5-325 MG PO TABS: 1.0000 | ORAL_TABLET | ORAL | 0 refills | Status: DC | PRN

## 2016-04-14 MED ORDER — METHOCARBAMOL 500 MG PO TABS: 500.0000 mg | ORAL_TABLET | Freq: Four times a day (QID) | ORAL | 0 refills | Status: DC | PRN

## 2016-04-14 MED ORDER — OXYCODONE HCL 10 MG PO TABS: 10.0000 mg | ORAL_TABLET | ORAL | 0 refills | Status: DC | PRN

## 2016-04-14 NOTE — Progress Notes (Signed)
Pt given D/C instructions with Rx's, verbal understanding was provided. Pt's IV was removed prior to D/C. Pt's incision is clean and dry with no sign of infection. Pt D/C'd home via wheelchair @ 1000 per MD order. Pt is stable @ D/C and has no other needs at this time. Rema Fendt, RN

## 2016-04-14 NOTE — Discharge Summary (Signed)
  Physician Discharge Summary  Patient ID: [name] MRN: [id number] DOB/AGE: 02/21/1956 60 y.o.  Admit date: 04/12/2016 Discharge date: 04/14/2016  Admission Diagnoses: Lumbar spondylolisthesis, L5-S1  Discharge Diagnoses: Same Active Problems:   Spondylolisthesis at L5-S1 level   Discharged Condition: Stable  Hospital Course:  [name] is a 60 y.o. male admitted after elective L5-S1 fusion. He was at neurologic baseline postop, and by POD#2 he had mild improvement in left leg symptoms. He was walking well, tolerating diet, voiding normally.  Treatments: Surgery - L5-S1 PLIF  Discharge Exam: Blood pressure 128/64, pulse 92, temperature 98.2 F (36.8 C), resp. rate 20, weight (!) 158.8 kg (350 lb), SpO2 98 %. Awake, alert, oriented Speech fluent, appropriate CN grossly intact 5/5 BUE/BLE Wound c/d/i  Disposition: 01-Home or Self Care     Medication List    STOP taking these medications   HYDROcodone-acetaminophen 7.5-325 MG tablet Commonly known as:  NORCO     TAKE these medications   aspirin 325 MG EC tablet Take 325 mg by mouth every morning.   cephALEXin 500 MG capsule Commonly known as:  KEFLEX Take 500 mg by mouth 2 (two) times daily. Take every day to prevent return of staff in infection   DULoxetine 60 MG capsule Commonly known as:  CYMBALTA Take 60 mg by mouth daily.   fluticasone 50 MCG/ACT nasal spray Commonly known as:  FLONASE Place 1 spray into both nostrils daily as needed for allergies.   gabapentin 300 MG capsule Commonly known as:  NEURONTIN Take 300 mg by mouth 2 (two) times daily.   GLUCOSAMINE HCL-MSM PO Take 1,500 mg by mouth 2 (two) times daily.   lisinopril-hydrochlorothiazide 20-12.5 MG tablet Commonly known as:  PRINZIDE,ZESTORETIC Take 1 tablet by mouth every morning.   meloxicam 15 MG tablet Commonly known as:  MOBIC Take 15 mg by mouth daily.   methocarbamol 500 MG tablet Commonly known as:   ROBAXIN Take 1 tablet (500 mg total) by mouth every 6 (six) hours as needed for muscle spasms.   multivitamin with minerals Tabs tablet Take 1 tablet by mouth daily.   omeprazole 40 MG capsule Commonly known as:  PRILOSEC Take 40 mg by mouth 2 (two) times daily.   Oxycodone HCl 10 MG Tabs Take 1 tablet (10 mg total) by mouth every 4 (four) hours as needed for severe pain.   oxyCODONE-acetaminophen 5-325 MG tablet Commonly known as:  PERCOCET/ROXICET Take 1 tablet by mouth every 4 (four) hours as needed for severe pain.   simvastatin 20 MG tablet Commonly known as:  ZOCOR Take 20 mg by mouth at bedtime.      Follow-up Information    Cam Dauphin, C, MD. Call in 3 week(s).   Specialty:  Neurosurgery Contact information: [address] [place] [address] [phone]           Signed: Lisbeth Renshaw, Salena Saner 04/14/2016, 8:31 AM

## 2016-04-14 NOTE — Anesthesia Postprocedure Evaluation (Signed)
Anesthesia Post Note  Patient: [name]  Procedure(s) Performed: Procedure(s) (LRB): Posterior Lumbar Interbody Fusion - Lumbar five-Sacral one (N/A)  Patient location during evaluation: PACU Anesthesia Type: General Level of consciousness: awake Pain management: pain level controlled Vital Signs Assessment: post-procedure vital signs reviewed and stable Respiratory status: spontaneous breathing Cardiovascular status: stable Postop Assessment: no signs of nausea or vomiting Anesthetic complications: no    Last Vitals:  Vitals:   04/14/16 0453 04/14/16 0826  BP: (!) 143/62 128/64  Pulse: 97 92  Resp: (!) 22 20  Temp: 37.1 C 36.8 C    Last Pain:  Vitals:   04/14/16 0805  TempSrc:   PainSc: 8                  Makynlie Rossini

## 2016-04-14 NOTE — Progress Notes (Signed)
Physical Therapy Treatment Patient Details Name: [name] MRN: [id number] DOB: April 27, 1956 Today's Date: 04/14/2016    History of Present Illness 60 y.o. male admitted to [facility] on 04/12/16 for elective L5-S1 decompression and fusion.  Pt with significant PMHx of R TKA, HTN, anemia, staph infection, L knee surgery (scope), and multiple back surgeries.      PT Comments    Patient progressing towards PT goals. Educated patient on car transfers, mobility expectations, compliance with precautions and pain management/positioning changes. Patient receptive. Discussed car transfer at length due to concerns regarding patient going home in low riding sedan. Patient receptive. Will continue to see as indicated.  Follow Up Recommendations  No PT follow up     Equipment Recommendations  Rolling walker with 5" wheels;Other (comment) (mostly for safe home entry)    Recommendations for Other Services       Precautions / Restrictions Precautions Precautions: Back Precaution Comments: handout provided and reviewed in detail Required Braces or Orthoses: Spinal Brace Spinal Brace: Lumbar corset;Applied in sitting position Restrictions Weight Bearing Restrictions: No    Mobility  Bed Mobility Overal bed mobility: Modified Independent             General bed mobility comments: increased time to perform, no cues or assist  Transfers Overall transfer level: Needs assistance Equipment used: Rolling walker (2 wheeled) Transfers: Sit to/from Stand Sit to Stand: Supervision         General transfer comment: VCs for positioning at EOB and hand placement  Ambulation/Gait Ambulation/Gait assistance: Supervision Ambulation Distance (Feet): 240 Feet Assistive device: Rolling walker (2 wheeled) Gait Pattern/deviations: Step-through pattern;Trunk flexed;Decreased stride length Gait velocity: decreased Gait velocity interpretation: Below normal speed for age/gender General Gait Details:  VCs for upright posture and increased cadence   Stairs            Wheelchair Mobility    Modified Rankin (Stroke Patients Only)       Balance     Sitting balance-Leahy Scale: Fair     Standing balance support: Bilateral upper extremity supported Standing balance-Leahy Scale: Fair                      Cognition Arousal/Alertness: Awake/alert Behavior During Therapy: WFL for tasks assessed/performed Overall Cognitive Status: Within Functional Limits for tasks assessed                      Exercises      General Comments        Pertinent Vitals/Pain Pain Assessment: 0-10 Faces Pain Scale: Hurts whole lot Pain Location: back and left leg Pain Descriptors / Indicators: Sore Pain Intervention(s): Monitored during session;Premedicated before session    Home Living                      Prior Function            PT Goals (current goals can now be found in the care plan section) Acute Rehab PT Goals Patient Stated Goal: to go home  PT Goal Formulation: With patient Time For Goal Achievement: 04/20/16 Potential to Achieve Goals: Good Progress towards PT goals: Progressing toward goals    Frequency  Min 5X/week    PT Plan Current plan remains appropriate    Co-evaluation             End of Session Equipment Utilized During Treatment: Back brace Activity Tolerance: Patient limited by pain Patient left: in bed;with  call bell/phone within reach     Time: 0809-0828 PT Time Calculation (min) (ACUTE ONLY): 19 min  Charges:  $Gait Training: 8-22 mins                    G CodesFabio Asa Apr 30, 2016, 9:37 AM Charlotte Crumb, PT DPT  [phone]

## 2016-08-03 ENCOUNTER — Other Ambulatory Visit: Payer: Self-pay | Admitting: Neurosurgery

## 2016-08-03 DIAGNOSIS — M4316 Spondylolisthesis, lumbar region: Secondary | ICD-10-CM

## 2016-08-17 ENCOUNTER — Ambulatory Visit
Admission: RE | Admit: 2016-08-17 | Discharge: 2016-08-17 | Disposition: A | Discharge status: Home / Self Care | Payer: Medicare Other | Source: Ambulatory Visit | Attending: Neurosurgery | Admitting: Neurosurgery

## 2016-08-17 DIAGNOSIS — M4316 Spondylolisthesis, lumbar region: Secondary | ICD-10-CM

## 2016-08-17 NOTE — Progress Notes (Signed)
I spoke with Crystal at Dr. Val RilesNundkumar's office to let her know Mr. [name]'s weight exceeds our table limit, so we were unable to perform his lumbar myelogram today.  I suggested she try [facility], as I believe they have equipment to accommodate larger patients and so that they can obtain better images.  Donell SievertJeanne Tache Bobst, RN

## 2016-08-17 NOTE — Discharge Instructions (Signed)

## 2016-08-25 ENCOUNTER — Other Ambulatory Visit (HOSPITAL_COMMUNITY): Payer: Self-pay | Admitting: Neurosurgery

## 2016-08-25 ENCOUNTER — Ambulatory Visit (HOSPITAL_COMMUNITY)
Admission: RE | Admit: 2016-08-25 | Discharge: 2016-08-25 | Disposition: A | Discharge status: Home / Self Care | Payer: Medicare Other | Source: Ambulatory Visit | Attending: Neurosurgery | Admitting: Neurosurgery

## 2016-08-25 ENCOUNTER — Encounter (HOSPITAL_COMMUNITY): Payer: Self-pay

## 2016-08-25 DIAGNOSIS — M4316 Spondylolisthesis, lumbar region: Secondary | ICD-10-CM

## 2016-08-26 ENCOUNTER — Other Ambulatory Visit: Payer: Self-pay | Admitting: Radiology

## 2016-08-30 ENCOUNTER — Other Ambulatory Visit (HOSPITAL_COMMUNITY): Payer: Medicare Other

## 2016-08-30 ENCOUNTER — Ambulatory Visit (HOSPITAL_COMMUNITY)
Admission: RE | Admit: 2016-08-30 | Discharge: 2016-08-30 | Disposition: A | Discharge status: Home / Self Care | Payer: Medicare Other | Source: Ambulatory Visit | Attending: Neurosurgery | Admitting: Neurosurgery

## 2016-08-30 DIAGNOSIS — M4316 Spondylolisthesis, lumbar region: Secondary | ICD-10-CM

## 2016-08-30 DIAGNOSIS — M5136 Other intervertebral disc degeneration, lumbar region: Secondary | ICD-10-CM | POA: Insufficient documentation

## 2016-08-30 DIAGNOSIS — M48061 Spinal stenosis, lumbar region without neurogenic claudication: Secondary | ICD-10-CM | POA: Diagnosis not present

## 2016-08-30 MED ORDER — IOPAMIDOL (ISOVUE-M 200) INJECTION 41%
INTRAMUSCULAR | Status: AC
  Administered 2016-08-30: 20 mL via INTRATHECAL
  Filled 2016-08-30: qty 10

## 2016-08-30 MED ORDER — LIDOCAINE HCL 1 % IJ SOLN
INTRAMUSCULAR | Status: AC
  Filled 2016-08-30: qty 10

## 2016-08-30 MED ORDER — IOPAMIDOL (ISOVUE-M 200) INJECTION 41%
20.0000 mL | Freq: Once | INTRAMUSCULAR | Status: AC
  Administered 2016-08-30: 20 mL via INTRATHECAL

## 2016-08-30 MED ORDER — LIDOCAINE HCL (PF) 1 % IJ SOLN
10.0000 mL | Freq: Once | INTRAMUSCULAR | Status: AC
  Administered 2016-08-30: 10 mL via INTRADERMAL

## 2016-08-30 MED ORDER — ONDANSETRON HCL 4 MG/2ML IJ SOLN: 4.0000 mg | Freq: Four times a day (QID) | INTRAMUSCULAR | Status: DC | PRN

## 2016-08-30 MED ORDER — HYDROCODONE-ACETAMINOPHEN 10-325 MG PO TABS
1.0000 | ORAL_TABLET | ORAL | Status: DC | PRN
  Administered 2016-08-30: 2 via ORAL
  Filled 2016-08-30 (×3): qty 2

## 2016-08-30 NOTE — Discharge Instructions (Signed)
Myelography, Care After °These instructions give you information on caring for yourself after your procedure. Your doctor may also give you more specific instructions. Call your doctor if you have any problems or questions after your procedure. °HOME CARE °· Rest the first day. °· When you rest, lie flat, with your head slightly raised (elevated). °· Avoid heavy lifting and activity for 48 hours, or as told by your doctor. °· You may take the bandage (dressing) off one day after the test, or as told by your doctor. °· Take all medicines only as told by your doctor. °· Ask your doctor when it is okay to take a shower or bath. °· Ask your doctor when your test results will be ready and how you can get them. Make sure you follow up and get your results. °· Do not drink alcohol for 24 hours, or as told by your doctor. °· Drink enough fluid to keep your pee (urine) clear or pale yellow. °GET HELP IF:  °· You have a fever. °· You have a headache. °· You feel sick to your stomach (nauseous) or throw up (vomit). °· You have pain or cramping in your belly (abdomen). °GET HELP RIGHT AWAY IF:  °· You have a headache with a stiff neck or fever. °· You have trouble breathing. °· Any of the places where the needles were put in are: °¨ Puffy (swollen) or red. °¨ Sore or hot to the touch. °¨ Draining yellowish-white fluid (pus). °¨ Bleeding. °MAKE SURE YOU: °· Understand these instructions. °· Will watch your condition. °· Will get help right away if you are not doing well or get worse. °This information is not intended to replace advice given to you by your health care provider. Make sure you discuss any questions you have with your health care provider. °Document Released: 06/14/2008 Document Revised: 09/26/2014 Document Reviewed: 06/18/2015 °Elsevier Interactive Patient Education © 2017 [employer] Inc. ° °

## 2016-09-07 ENCOUNTER — Other Ambulatory Visit: Payer: Self-pay | Admitting: Neurosurgery

## 2016-10-12 ENCOUNTER — Encounter (HOSPITAL_COMMUNITY)
Admission: RE | Admit: 2016-10-12 | Discharge: 2016-10-12 | Disposition: A | Discharge status: Home / Self Care | Payer: Medicare Other | Source: Ambulatory Visit | Attending: Neurosurgery | Admitting: Neurosurgery

## 2016-10-12 ENCOUNTER — Encounter (HOSPITAL_COMMUNITY): Payer: Self-pay

## 2016-10-12 DIAGNOSIS — Z01812 Encounter for preprocedural laboratory examination: Secondary | ICD-10-CM | POA: Diagnosis present

## 2016-10-12 DIAGNOSIS — M4316 Spondylolisthesis, lumbar region: Secondary | ICD-10-CM | POA: Insufficient documentation

## 2016-10-12 LAB — CBC
HCT: 43.4 % (ref 39.0–52.0)
HEMOGLOBIN: 15.1 g/dL (ref 13.0–17.0)
MCH: 31.7 pg (ref 26.0–34.0)
MCHC: 34.8 g/dL (ref 30.0–36.0)
MCV: 91 fL (ref 78.0–100.0)
PLATELETS: 262 10*3/uL (ref 150–400)
RBC: 4.77 MIL/uL (ref 4.22–5.81)
RDW: 13.4 % (ref 11.5–15.5)
WBC: 7.4 10*3/uL (ref 4.0–10.5)

## 2016-10-12 LAB — BASIC METABOLIC PANEL
ANION GAP: 10 (ref 5–15)
BUN: 14 mg/dL (ref 6–20)
CALCIUM: 9.9 mg/dL (ref 8.9–10.3)
CO2: 26 mmol/L (ref 22–32)
CREATININE: 0.99 mg/dL (ref 0.61–1.24)
Chloride: 99 mmol/L — ABNORMAL LOW (ref 101–111)
GLUCOSE: 95 mg/dL (ref 65–99)
Potassium: 5.1 mmol/L (ref 3.5–5.1)
Sodium: 135 mmol/L (ref 135–145)

## 2016-10-12 LAB — TYPE AND SCREEN
ABO/RH(D): A POS
ANTIBODY SCREEN: NEGATIVE

## 2016-10-12 LAB — SURGICAL PCR SCREEN
MRSA, PCR: POSITIVE — AB
Staphylococcus aureus: POSITIVE — AB

## 2016-10-12 MED ORDER — CHLORHEXIDINE GLUCONATE CLOTH 2 % EX PADS: 6.0000 | MEDICATED_PAD | Freq: Once | CUTANEOUS | Status: DC

## 2016-10-12 NOTE — Progress Notes (Signed)
PCP: Benedetto GoadFred Wolfe No cardiologist. Pt states he had a normal heart cath in 2005 d/t chest pain from pulling a muscle. Pt states everything was fine and does not remember where this was done.   Pt with OSA and uses CPAP, does not remember where his sleep study was done, but it was "many years" ago.   No complaints of chest pain, SOB or signs of infection at PAT appointment.

## 2016-10-12 NOTE — Progress Notes (Signed)
Mupirocin Ointment Rx called [facility] [address] for positive PCR of MRSA and Staph. Pt notified and voiced understanding. He states that surgery has been moved to 10/28/16. I told him to go ahead and start using the Mupirocin.

## 2016-10-12 NOTE — Pre-Procedure Instructions (Signed)
[name]  10/12/2016      [id number] [address] [place], [place] - [phone] [facility] [address] [place] [id number] Phone: [phone] Fax: [phone]    Your procedure is scheduled on Friday January 26.  Report to [facility] Admitting at 6:30 A.M.  Call this number if you have problems the morning of surgery:  [phone]   Remember:  Do not eat food or drink liquids after midnight.  Take these medicines the morning of surgery with A SIP OF WATER: gabapentin (neurontin), omeprazole (prilosec), duloxetine (Cymbalta), hydrocodone (norco) if needed  7 days prior to surgery STOP taking any Aspirin, Aleve, Naproxen, Ibuprofen, Motrin, Advil, Goody's, BC's, all herbal medications, fish oil, and all vitamins    Do not wear jewelry, make-up or nail polish.  Do not wear lotions, powders, or perfumes, or deoderant.  Do not shave 48 hours prior to surgery.  Men may shave face and neck.  Do not bring valuables to the hospital.  [facility] is not responsible for any belongings or valuables.  Contacts, dentures or bridgework may not be worn into surgery.  Leave your suitcase in the car.  After surgery it may be brought to your room.  For patients admitted to the hospital, discharge time will be determined by your treatment team.  Patients discharged the day of surgery will not be allowed to drive home.    Special instructions:    [facility]- Preparing For Surgery  Before surgery, you can play an important role. Because skin is not sterile, your skin needs to be as free of germs as possible. You can reduce the number of germs on your skin by washing with CHG (chlorahexidine gluconate) Soap before surgery.  CHG is an antiseptic cleaner which kills germs and bonds with the skin to continue killing germs even after washing.  Please do not use if you have an allergy to CHG or antibacterial soaps. If your skin becomes  reddened/irritated stop using the CHG.  Do not shave (including legs and underarms) for at least 48 hours prior to first CHG shower. It is OK to shave your face.  Please follow these instructions carefully.   1. Shower the NIGHT BEFORE SURGERY and the MORNING OF SURGERY with CHG.   2. If you chose to wash your hair, wash your hair first as usual with your normal shampoo.  3. After you shampoo, rinse your hair and body thoroughly to remove the shampoo.  4. Use CHG as you would any other liquid soap. You can apply CHG directly to the skin and wash gently with a scrungie or a clean washcloth.   5. Apply the CHG Soap to your body ONLY FROM THE NECK DOWN.  Do not use on open wounds or open sores. Avoid contact with your eyes, ears, mouth and genitals (private parts). Wash genitals (private parts) with your normal soap.  6. Wash thoroughly, paying special attention to the area where your surgery will be performed.  7. Thoroughly rinse your body with warm water from the neck down.  8. DO NOT shower/wash with your normal soap after using and rinsing off the CHG Soap.  9. Pat yourself dry with a CLEAN TOWEL.   10. Wear CLEAN PAJAMAS   11. Place CLEAN SHEETS on your bed the night of your first shower and DO NOT SLEEP WITH PETS.    Day of Surgery: Do not apply any deodorants/lotions. Please  wear clean clothes to the hospital/surgery center.      Please read over the following fact sheets that you were given. MRSA Information

## 2016-10-14 ENCOUNTER — Other Ambulatory Visit (HOSPITAL_COMMUNITY): Payer: Self-pay | Admitting: Neurosurgery

## 2016-10-14 DIAGNOSIS — M4316 Spondylolisthesis, lumbar region: Secondary | ICD-10-CM

## 2016-10-17 ENCOUNTER — Ambulatory Visit (HOSPITAL_COMMUNITY)
Admission: RE | Admit: 2016-10-17 | Discharge: 2016-10-17 | Disposition: A | Discharge status: Home / Self Care | Payer: Medicare Other | Source: Ambulatory Visit | Attending: Neurosurgery | Admitting: Neurosurgery

## 2016-10-17 DIAGNOSIS — M48061 Spinal stenosis, lumbar region without neurogenic claudication: Secondary | ICD-10-CM | POA: Insufficient documentation

## 2016-10-17 DIAGNOSIS — M4316 Spondylolisthesis, lumbar region: Secondary | ICD-10-CM | POA: Insufficient documentation

## 2016-10-17 DIAGNOSIS — Z9889 Other specified postprocedural states: Secondary | ICD-10-CM | POA: Diagnosis not present

## 2016-10-17 DIAGNOSIS — M5136 Other intervertebral disc degeneration, lumbar region: Secondary | ICD-10-CM | POA: Diagnosis not present

## 2016-10-27 ENCOUNTER — Encounter (HOSPITAL_COMMUNITY): Payer: Self-pay | Admitting: *Deleted

## 2016-10-27 MED ORDER — VANCOMYCIN HCL 10 G IV SOLR
1500.0000 mg | INTRAVENOUS | Status: AC
  Administered 2016-10-28: 1500 mg via INTRAVENOUS
  Filled 2016-10-27: qty 1500

## 2016-10-27 NOTE — Progress Notes (Signed)
Pt stated that he was just here for a PAT visit on 10/12/16 and " nothing has changed." Pt made aware to stop taking Aspirin, vitamins, fish oil, Glucosamine and herbal medications. Do not take any NSAIDs ie: Ibuprofen, Advil, Naproxen, BC and Goody Powder or any medication containing Aspirin such as Mobic. Pt denies SOB, chest pain, and being under the care of a cardiologist. Pt denies having a stress test and echo. Pt verbalized understanding of all pre-op instructions.

## 2016-10-28 ENCOUNTER — Inpatient Hospital Stay (HOSPITAL_COMMUNITY)
Admission: RE | Admit: 2016-10-28 | Discharge: 2016-10-30 | DRG: 455 | Disposition: A | Discharge status: Home / Self Care | Payer: Medicare Other | Source: Ambulatory Visit | Attending: Neurosurgery | Admitting: Neurosurgery

## 2016-10-28 ENCOUNTER — Encounter (HOSPITAL_COMMUNITY): Payer: Self-pay | Admitting: Certified Registered Nurse Anesthetist

## 2016-10-28 ENCOUNTER — Encounter (HOSPITAL_COMMUNITY)
Admission: RE | Disposition: A | Discharge status: Home / Self Care | Payer: Self-pay | Source: Ambulatory Visit | Attending: Neurosurgery

## 2016-10-28 ENCOUNTER — Inpatient Hospital Stay (HOSPITAL_COMMUNITY): Payer: Medicare Other | Admitting: Certified Registered Nurse Anesthetist

## 2016-10-28 ENCOUNTER — Inpatient Hospital Stay (HOSPITAL_COMMUNITY): Payer: Medicare Other

## 2016-10-28 DIAGNOSIS — Z9889 Other specified postprocedural states: Secondary | ICD-10-CM | POA: Diagnosis not present

## 2016-10-28 DIAGNOSIS — Z79899 Other long term (current) drug therapy: Secondary | ICD-10-CM | POA: Diagnosis not present

## 2016-10-28 DIAGNOSIS — R2689 Other abnormalities of gait and mobility: Secondary | ICD-10-CM

## 2016-10-28 DIAGNOSIS — M1990 Unspecified osteoarthritis, unspecified site: Secondary | ICD-10-CM | POA: Diagnosis present

## 2016-10-28 DIAGNOSIS — Z880 Allergy status to penicillin: Secondary | ICD-10-CM

## 2016-10-28 DIAGNOSIS — G4730 Sleep apnea, unspecified: Secondary | ICD-10-CM | POA: Diagnosis present

## 2016-10-28 DIAGNOSIS — Z96651 Presence of right artificial knee joint: Secondary | ICD-10-CM | POA: Diagnosis present

## 2016-10-28 DIAGNOSIS — Z981 Arthrodesis status: Secondary | ICD-10-CM | POA: Diagnosis present

## 2016-10-28 DIAGNOSIS — K219 Gastro-esophageal reflux disease without esophagitis: Secondary | ICD-10-CM | POA: Diagnosis present

## 2016-10-28 DIAGNOSIS — Z87891 Personal history of nicotine dependence: Secondary | ICD-10-CM

## 2016-10-28 DIAGNOSIS — Z7951 Long term (current) use of inhaled steroids: Secondary | ICD-10-CM | POA: Diagnosis not present

## 2016-10-28 DIAGNOSIS — M48061 Spinal stenosis, lumbar region without neurogenic claudication: Secondary | ICD-10-CM | POA: Diagnosis present

## 2016-10-28 DIAGNOSIS — M4316 Spondylolisthesis, lumbar region: Secondary | ICD-10-CM | POA: Diagnosis present

## 2016-10-28 DIAGNOSIS — I10 Essential (primary) hypertension: Secondary | ICD-10-CM | POA: Diagnosis present

## 2016-10-28 DIAGNOSIS — M5136 Other intervertebral disc degeneration, lumbar region: Secondary | ICD-10-CM

## 2016-10-28 DIAGNOSIS — Z419 Encounter for procedure for purposes other than remedying health state, unspecified: Secondary | ICD-10-CM

## 2016-10-28 HISTORY — PX: APPLICATION OF ROBOTIC ASSISTANCE FOR SPINAL PROCEDURE: SHX6753

## 2016-10-28 LAB — CBC
HCT: 46.7 % (ref 39.0–52.0)
Hemoglobin: 15.6 g/dL (ref 13.0–17.0)
MCH: 31 pg (ref 26.0–34.0)
MCHC: 33.4 g/dL (ref 30.0–36.0)
MCV: 92.7 fL (ref 78.0–100.0)
PLATELETS: 251 10*3/uL (ref 150–400)
RBC: 5.04 MIL/uL (ref 4.22–5.81)
RDW: 13.7 % (ref 11.5–15.5)
WBC: 7.7 10*3/uL (ref 4.0–10.5)

## 2016-10-28 LAB — TYPE AND SCREEN
ABO/RH(D): A POS
ANTIBODY SCREEN: NEGATIVE

## 2016-10-28 LAB — BASIC METABOLIC PANEL
Anion gap: 13 (ref 5–15)
BUN: 16 mg/dL (ref 6–20)
CALCIUM: 10 mg/dL (ref 8.9–10.3)
CO2: 23 mmol/L (ref 22–32)
CREATININE: 1.07 mg/dL (ref 0.61–1.24)
Chloride: 101 mmol/L (ref 101–111)
GFR calc non Af Amer: >60 mL/min (ref >60)
Glucose, Bld: 99 mg/dL (ref 65–99)
Potassium: 3.6 mmol/L (ref 3.5–5.1)
SODIUM: 137 mmol/L (ref 135–145)

## 2016-10-28 SURGERY — POSTERIOR LUMBAR FUSION 1 LEVEL
Anesthesia: General

## 2016-10-28 MED ORDER — GABAPENTIN 300 MG PO CAPS
900.0000 mg | ORAL_CAPSULE | Freq: Three times a day (TID) | ORAL | Status: DC
  Administered 2016-10-28 – 2016-10-30 (×5): 900 mg via ORAL
  Filled 2016-10-28 (×6): qty 3

## 2016-10-28 MED ORDER — LISINOPRIL 20 MG PO TABS
20.0000 mg | ORAL_TABLET | Freq: Every day | ORAL | Status: DC
  Administered 2016-10-29 – 2016-10-30 (×2): 20 mg via ORAL
  Filled 2016-10-28 (×3): qty 1

## 2016-10-28 MED ORDER — KETOROLAC TROMETHAMINE 30 MG/ML IJ SOLN
30.0000 mg | Freq: Once | INTRAMUSCULAR | Status: AC
  Administered 2016-10-28: 30 mg via INTRAVENOUS
  Filled 2016-10-28: qty 1

## 2016-10-28 MED ORDER — HYDROMORPHONE HCL 1 MG/ML IJ SOLN
0.2500 mg | INTRAMUSCULAR | Status: DC | PRN
  Administered 2016-10-28 (×4): 0.5 mg via INTRAVENOUS

## 2016-10-28 MED ORDER — FENTANYL CITRATE (PF) 100 MCG/2ML IJ SOLN
INTRAMUSCULAR | Status: AC
  Filled 2016-10-28: qty 4

## 2016-10-28 MED ORDER — EPHEDRINE 5 MG/ML INJ
INTRAVENOUS | Status: AC
  Filled 2016-10-28: qty 20

## 2016-10-28 MED ORDER — SODIUM CHLORIDE 0.9 % IV SOLN
250.0000 mL | INTRAVENOUS | Status: DC
  Administered 2016-10-28: 250 mL via INTRAVENOUS

## 2016-10-28 MED ORDER — SUGAMMADEX SODIUM 200 MG/2ML IV SOLN: INTRAVENOUS | Status: DC | PRN

## 2016-10-28 MED ORDER — GLUCOSAMINE HCL-MSM 1500-500 MG/30ML PO LIQD: Freq: Two times a day (BID) | ORAL | Status: DC

## 2016-10-28 MED ORDER — KETAMINE HCL-SODIUM CHLORIDE 100-0.9 MG/10ML-% IV SOSY
PREFILLED_SYRINGE | INTRAVENOUS | Status: AC
  Filled 2016-10-28: qty 10

## 2016-10-28 MED ORDER — MENTHOL 3 MG MT LOZG: 1.0000 | LOZENGE | OROMUCOSAL | Status: DC | PRN

## 2016-10-28 MED ORDER — ACETAMINOPHEN 325 MG PO TABS: 650.0000 mg | ORAL_TABLET | ORAL | Status: DC | PRN

## 2016-10-28 MED ORDER — THROMBIN 20000 UNITS EX SOLR
CUTANEOUS | Status: AC
Start: 2016-10-28 — End: 2016-10-28
  Filled 2016-10-28: qty 20000

## 2016-10-28 MED ORDER — 0.9 % SODIUM CHLORIDE (POUR BTL) OPTIME
TOPICAL | Status: DC | PRN
  Administered 2016-10-28: 1000 mL

## 2016-10-28 MED ORDER — EPHEDRINE SULFATE-NACL 50-0.9 MG/10ML-% IV SOSY
PREFILLED_SYRINGE | INTRAVENOUS | Status: DC | PRN
  Administered 2016-10-28: 5 mg via INTRAVENOUS

## 2016-10-28 MED ORDER — MIDAZOLAM HCL 2 MG/2ML IJ SOLN
INTRAMUSCULAR | Status: AC
  Filled 2016-10-28: qty 2

## 2016-10-28 MED ORDER — SODIUM CHLORIDE 0.9% FLUSH: 3.0000 mL | INTRAVENOUS | Status: DC | PRN

## 2016-10-28 MED ORDER — KETOROLAC TROMETHAMINE 30 MG/ML IJ SOLN
INTRAMUSCULAR | Status: AC
  Administered 2016-10-28: 30 mg
  Filled 2016-10-28: qty 1

## 2016-10-28 MED ORDER — HYDROCHLOROTHIAZIDE 12.5 MG PO CAPS
12.5000 mg | ORAL_CAPSULE | Freq: Every day | ORAL | Status: DC
  Administered 2016-10-29 – 2016-10-30 (×2): 12.5 mg via ORAL
  Filled 2016-10-28 (×3): qty 1

## 2016-10-28 MED ORDER — FENTANYL CITRATE (PF) 100 MCG/2ML IJ SOLN
INTRAMUSCULAR | Status: DC | PRN
  Administered 2016-10-28: 25 ug via INTRAVENOUS
  Administered 2016-10-28 (×6): 50 ug via INTRAVENOUS

## 2016-10-28 MED ORDER — SUGAMMADEX SODIUM 200 MG/2ML IV SOLN
INTRAVENOUS | Status: DC | PRN
  Administered 2016-10-28: 300 mg via INTRAVENOUS

## 2016-10-28 MED ORDER — SODIUM CHLORIDE 0.9 % IV SOLN
INTRAVENOUS | Status: DC
  Administered 2016-10-28: 17:00:00 via INTRAVENOUS

## 2016-10-28 MED ORDER — SENNA 8.6 MG PO TABS
1.0000 | ORAL_TABLET | Freq: Two times a day (BID) | ORAL | Status: DC
  Administered 2016-10-28 – 2016-10-30 (×4): 8.6 mg via ORAL
  Filled 2016-10-28 (×4): qty 1

## 2016-10-28 MED ORDER — PROPOFOL 10 MG/ML IV BOLUS
INTRAVENOUS | Status: DC | PRN
  Administered 2016-10-28: 200 mg via INTRAVENOUS

## 2016-10-28 MED ORDER — OXYCODONE-ACETAMINOPHEN 5-325 MG PO TABS
1.0000 | ORAL_TABLET | ORAL | Status: DC | PRN
  Administered 2016-10-28 – 2016-10-30 (×8): 2 via ORAL
  Filled 2016-10-28 (×8): qty 2

## 2016-10-28 MED ORDER — ROCURONIUM BROMIDE 50 MG/5ML IV SOSY
PREFILLED_SYRINGE | INTRAVENOUS | Status: AC
  Filled 2016-10-28: qty 25

## 2016-10-28 MED ORDER — KETAMINE HCL 10 MG/ML IJ SOLN
INTRAMUSCULAR | Status: DC | PRN
  Administered 2016-10-28: 80 mg via INTRAVENOUS

## 2016-10-28 MED ORDER — HYDROMORPHONE HCL 1 MG/ML IJ SOLN
INTRAMUSCULAR | Status: AC
  Filled 2016-10-28: qty 1

## 2016-10-28 MED ORDER — HYDROMORPHONE HCL 1 MG/ML IJ SOLN
INTRAMUSCULAR | Status: AC
  Filled 2016-10-28: qty 0.5

## 2016-10-28 MED ORDER — PROPOFOL 10 MG/ML IV BOLUS
INTRAVENOUS | Status: AC
  Filled 2016-10-28: qty 40

## 2016-10-28 MED ORDER — DULOXETINE HCL 60 MG PO CPEP
60.0000 mg | ORAL_CAPSULE | Freq: Every day | ORAL | Status: DC
  Administered 2016-10-29 – 2016-10-30 (×2): 60 mg via ORAL
  Filled 2016-10-28 (×3): qty 1

## 2016-10-28 MED ORDER — ADULT MULTIVITAMIN W/MINERALS CH
1.0000 | ORAL_TABLET | Freq: Every day | ORAL | Status: DC
  Administered 2016-10-28 – 2016-10-30 (×3): 1 via ORAL
  Filled 2016-10-28 (×3): qty 1

## 2016-10-28 MED ORDER — HYDROMORPHONE HCL 1 MG/ML IJ SOLN
0.5000 mg | INTRAMUSCULAR | Status: DC | PRN
  Administered 2016-10-29: 1 mg via INTRAVENOUS
  Filled 2016-10-28: qty 1

## 2016-10-28 MED ORDER — PHENYLEPHRINE 40 MCG/ML (10ML) SYRINGE FOR IV PUSH (FOR BLOOD PRESSURE SUPPORT)
PREFILLED_SYRINGE | INTRAVENOUS | Status: AC
  Filled 2016-10-28: qty 20

## 2016-10-28 MED ORDER — SUGAMMADEX SODIUM 500 MG/5ML IV SOLN
INTRAVENOUS | Status: AC
  Filled 2016-10-28: qty 5

## 2016-10-28 MED ORDER — ONDANSETRON HCL 4 MG/2ML IJ SOLN
INTRAMUSCULAR | Status: AC
  Filled 2016-10-28: qty 2

## 2016-10-28 MED ORDER — ONDANSETRON HCL 4 MG/2ML IJ SOLN: 4.0000 mg | INTRAMUSCULAR | Status: DC | PRN

## 2016-10-28 MED ORDER — VANCOMYCIN HCL 10 G IV SOLR
2000.0000 mg | Freq: Once | INTRAVENOUS | Status: AC
  Administered 2016-10-28: 2000 mg via INTRAVENOUS
  Filled 2016-10-28: qty 2000

## 2016-10-28 MED ORDER — BISACODYL 10 MG RE SUPP: 10.0000 mg | Freq: Every day | RECTAL | Status: DC | PRN

## 2016-10-28 MED ORDER — ACETAMINOPHEN 650 MG RE SUPP: 650.0000 mg | RECTAL | Status: DC | PRN

## 2016-10-28 MED ORDER — LIDOCAINE-EPINEPHRINE (PF) 2 %-1:200000 IJ SOLN
INTRAMUSCULAR | Status: AC
  Filled 2016-10-28: qty 20

## 2016-10-28 MED ORDER — ALBUMIN HUMAN 5 % IV SOLN
INTRAVENOUS | Status: DC | PRN
  Administered 2016-10-28 (×3): via INTRAVENOUS

## 2016-10-28 MED ORDER — THROMBIN 5000 UNITS EX SOLR
CUTANEOUS | Status: DC | PRN
  Administered 2016-10-28 (×2): via TOPICAL

## 2016-10-28 MED ORDER — FLUTICASONE PROPIONATE 50 MCG/ACT NA SUSP: 1.0000 | Freq: Every day | NASAL | Status: DC | PRN

## 2016-10-28 MED ORDER — MIDAZOLAM HCL 5 MG/5ML IJ SOLN
INTRAMUSCULAR | Status: DC | PRN
  Administered 2016-10-28: 2 mg via INTRAVENOUS

## 2016-10-28 MED ORDER — SODIUM CHLORIDE 0.9% FLUSH
3.0000 mL | Freq: Two times a day (BID) | INTRAVENOUS | Status: DC
  Administered 2016-10-28 – 2016-10-30 (×4): 3 mL via INTRAVENOUS

## 2016-10-28 MED ORDER — SODIUM CHLORIDE 0.9 % IR SOLN
Status: DC | PRN
  Administered 2016-10-28: 11:00:00

## 2016-10-28 MED ORDER — SIMVASTATIN 20 MG PO TABS
20.0000 mg | ORAL_TABLET | Freq: Every day | ORAL | Status: DC
  Administered 2016-10-28 – 2016-10-29 (×2): 20 mg via ORAL
  Filled 2016-10-28 (×2): qty 1

## 2016-10-28 MED ORDER — HYDROMORPHONE HCL 1 MG/ML IJ SOLN
INTRAMUSCULAR | Status: AC
Start: 2016-10-28 — End: 2016-10-29
  Filled 2016-10-28: qty 0.5

## 2016-10-28 MED ORDER — ONDANSETRON HCL 4 MG/2ML IJ SOLN
INTRAMUSCULAR | Status: DC | PRN
Start: 2016-10-28 — End: 2016-10-28
  Administered 2016-10-28: 4 mg via INTRAVENOUS

## 2016-10-28 MED ORDER — SODIUM CHLORIDE 0.9 % IV SOLN
0.1000 mg/kg/h | INTRAVENOUS | Status: DC
  Filled 2016-10-28: qty 2

## 2016-10-28 MED ORDER — LACTATED RINGERS IV SOLN
INTRAVENOUS | Status: DC
  Administered 2016-10-28 (×6): via INTRAVENOUS

## 2016-10-28 MED ORDER — LIDOCAINE-EPINEPHRINE (PF) 2 %-1:200000 IJ SOLN
INTRAMUSCULAR | Status: DC | PRN
  Administered 2016-10-28: 5 mL via INTRADERMAL

## 2016-10-28 MED ORDER — DEXAMETHASONE SODIUM PHOSPHATE 10 MG/ML IJ SOLN
INTRAMUSCULAR | Status: AC
  Filled 2016-10-28: qty 1

## 2016-10-28 MED ORDER — LACTATED RINGERS IV SOLN: INTRAVENOUS | Status: DC

## 2016-10-28 MED ORDER — SUCCINYLCHOLINE CHLORIDE 200 MG/10ML IV SOSY
PREFILLED_SYRINGE | INTRAVENOUS | Status: AC
  Filled 2016-10-28: qty 10

## 2016-10-28 MED ORDER — PHENYLEPHRINE 40 MCG/ML (10ML) SYRINGE FOR IV PUSH (FOR BLOOD PRESSURE SUPPORT)
PREFILLED_SYRINGE | INTRAVENOUS | Status: DC | PRN
  Administered 2016-10-28: 80 ug via INTRAVENOUS
  Administered 2016-10-28: 40 ug via INTRAVENOUS
  Administered 2016-10-28: 120 ug via INTRAVENOUS
  Administered 2016-10-28: 80 ug via INTRAVENOUS
  Administered 2016-10-28 (×2): 40 ug via INTRAVENOUS
  Administered 2016-10-28: 80 ug via INTRAVENOUS

## 2016-10-28 MED ORDER — DEXTROSE 5 % IV SOLN
INTRAVENOUS | Status: DC | PRN
  Administered 2016-10-28: 10 ug/min via INTRAVENOUS

## 2016-10-28 MED ORDER — PANTOPRAZOLE SODIUM 40 MG PO TBEC
40.0000 mg | DELAYED_RELEASE_TABLET | Freq: Every day | ORAL | Status: DC
  Administered 2016-10-28 – 2016-10-30 (×3): 40 mg via ORAL
  Filled 2016-10-28 (×3): qty 1

## 2016-10-28 MED ORDER — LISINOPRIL-HYDROCHLOROTHIAZIDE 20-12.5 MG PO TABS: 1.0000 | ORAL_TABLET | Freq: Every morning | ORAL | Status: DC

## 2016-10-28 MED ORDER — THROMBIN 5000 UNITS EX SOLR
CUTANEOUS | Status: AC
  Filled 2016-10-28: qty 5000

## 2016-10-28 MED ORDER — OXYCODONE HCL 5 MG PO TABS: 10.0000 mg | ORAL_TABLET | Freq: Once | ORAL | Status: DC

## 2016-10-28 MED ORDER — METHOCARBAMOL 500 MG PO TABS
500.0000 mg | ORAL_TABLET | Freq: Four times a day (QID) | ORAL | Status: DC | PRN
  Administered 2016-10-29 (×2): 500 mg via ORAL
  Filled 2016-10-28 (×3): qty 1

## 2016-10-28 MED ORDER — ROCURONIUM BROMIDE 10 MG/ML (PF) SYRINGE
PREFILLED_SYRINGE | INTRAVENOUS | Status: DC | PRN
  Administered 2016-10-28 (×2): 10 mg via INTRAVENOUS
  Administered 2016-10-28 (×2): 50 mg via INTRAVENOUS
  Administered 2016-10-28: 10 mg via INTRAVENOUS
  Administered 2016-10-28 (×2): 20 mg via INTRAVENOUS

## 2016-10-28 MED ORDER — DOCUSATE SODIUM 100 MG PO CAPS
100.0000 mg | ORAL_CAPSULE | Freq: Two times a day (BID) | ORAL | Status: DC
  Administered 2016-10-28 – 2016-10-30 (×4): 100 mg via ORAL
  Filled 2016-10-28 (×4): qty 1

## 2016-10-28 MED ORDER — SUCCINYLCHOLINE CHLORIDE 200 MG/10ML IV SOSY
PREFILLED_SYRINGE | INTRAVENOUS | Status: DC | PRN
  Administered 2016-10-28: 120 mg via INTRAVENOUS

## 2016-10-28 MED ORDER — TRIAMCINOLONE ACETONIDE 0.1 % EX CREA
1.0000 "application " | TOPICAL_CREAM | Freq: Every day | CUTANEOUS | Status: DC | PRN
  Filled 2016-10-28: qty 15

## 2016-10-28 MED ORDER — OXYCODONE HCL 5 MG PO TABS
ORAL_TABLET | ORAL | Status: AC
  Administered 2016-10-28: 10 mg
  Filled 2016-10-28: qty 2

## 2016-10-28 MED ORDER — SODIUM CHLORIDE 0.9 % IV SOLN
INTRAVENOUS | Status: DC | PRN
  Administered 2016-10-28: 6 ug/kg/min via INTRAVENOUS
  Administered 2016-10-28: 13:00:00 via INTRAVENOUS
  Administered 2016-10-28: 8 ug/kg/min via INTRAVENOUS

## 2016-10-28 MED ORDER — THROMBIN 20000 UNITS EX SOLR
CUTANEOUS | Status: DC | PRN
  Administered 2016-10-28: 11:00:00 via TOPICAL

## 2016-10-28 MED ORDER — DEXAMETHASONE SODIUM PHOSPHATE 10 MG/ML IJ SOLN
INTRAMUSCULAR | Status: DC | PRN
  Administered 2016-10-28: 10 mg via INTRAVENOUS

## 2016-10-28 MED ORDER — LIDOCAINE 2% (20 MG/ML) 5 ML SYRINGE
INTRAMUSCULAR | Status: AC
  Filled 2016-10-28: qty 10

## 2016-10-28 MED ORDER — PHENOL 1.4 % MT LIQD: 1.0000 | OROMUCOSAL | Status: DC | PRN

## 2016-10-28 MED ORDER — BUPIVACAINE HCL (PF) 0.5 % IJ SOLN
INTRAMUSCULAR | Status: DC | PRN
  Administered 2016-10-28: 5 mL

## 2016-10-28 MED ORDER — KETOROLAC TROMETHAMINE 30 MG/ML IJ SOLN
30.0000 mg | Freq: Four times a day (QID) | INTRAMUSCULAR | Status: DC
  Administered 2016-10-28 – 2016-10-30 (×6): 30 mg via INTRAVENOUS
  Filled 2016-10-28 (×7): qty 1

## 2016-10-28 MED ORDER — DEXTROSE 5 % IV SOLN
500.0000 mg | Freq: Four times a day (QID) | INTRAVENOUS | Status: DC | PRN
  Administered 2016-10-28: 500 mg via INTRAVENOUS
  Filled 2016-10-28 (×3): qty 5

## 2016-10-28 SURGICAL SUPPLY — 85 items
BAG DECANTER FOR FLEXI CONT (MISCELLANEOUS) ×3 IMPLANT
BENZOIN TINCTURE PRP APPL 2/3 (GAUZE/BANDAGES/DRESSINGS) IMPLANT
BIT DRILL LONG 3.0X30 (BIT) ×2 IMPLANT
BIT DRILL LONG 3.0X30MM (BIT) ×1
BIT DRILL LONG 3X80 (BIT) IMPLANT
BIT DRILL LONG 4X80 (BIT) IMPLANT
BIT DRILL SHORT 3.0X30 (BIT) IMPLANT
BIT DRILL SHORT 3X80 (BIT) IMPLANT
BIT DRILL SHORT 3X80MM (BIT)
BLADE CLIPPER SURG (BLADE) IMPLANT
BLADE SURG 11 STRL SS (BLADE) ×3 IMPLANT
BUR MATCHSTICK NEURO 3.0 LAGG (BURR) ×3 IMPLANT
BUR PRECISION FLUTE 5.0 (BURR) ×3 IMPLANT
BUR PRECISION FLUTE 6.0 (BURR) ×3 IMPLANT
CANISTER SUCT 3000ML PPV (MISCELLANEOUS) ×3 IMPLANT
CARTRIDGE OIL MAESTRO DRILL (MISCELLANEOUS) ×1 IMPLANT
CLOSURE WOUND 1/2 X4 (GAUZE/BANDAGES/DRESSINGS)
CONT SPEC 4OZ CLIKSEAL STRL BL (MISCELLANEOUS) ×3 IMPLANT
COVER BACK TABLE 60X90IN (DRAPES) ×3 IMPLANT
DECANTER SPIKE VIAL GLASS SM (MISCELLANEOUS) ×3 IMPLANT
DERMABOND ADVANCED (GAUZE/BANDAGES/DRESSINGS) ×2
DERMABOND ADVANCED .7 DNX12 (GAUZE/BANDAGES/DRESSINGS) ×1 IMPLANT
DEVICE INTERBODY ELEVATE 23X7 (Cage) ×6 IMPLANT
DIFFUSER DRILL AIR PNEUMATIC (MISCELLANEOUS) ×3 IMPLANT
DRAPE C-ARM 42X72 X-RAY (DRAPES) ×3 IMPLANT
DRAPE C-ARMOR (DRAPES) ×3 IMPLANT
DRAPE LAPAROTOMY 100X72X124 (DRAPES) ×3 IMPLANT
DRAPE POUCH INSTRU U-SHP 10X18 (DRAPES) ×3 IMPLANT
DRAPE SHEET LG 3/4 BI-LAMINATE (DRAPES) ×3 IMPLANT
DRAPE SURG 17X23 STRL (DRAPES) ×3 IMPLANT
DRSG OPSITE POSTOP 4X10 (GAUZE/BANDAGES/DRESSINGS) ×3 IMPLANT
DRSG OPSITE POSTOP 4X8 (GAUZE/BANDAGES/DRESSINGS) ×3 IMPLANT
DURAPREP 26ML APPLICATOR (WOUND CARE) ×9 IMPLANT
ELECT REM PT RETURN 9FT ADLT (ELECTROSURGICAL) ×3
ELECTRODE REM PT RTRN 9FT ADLT (ELECTROSURGICAL) ×1 IMPLANT
GAUZE SPONGE 4X4 12PLY STRL (GAUZE/BANDAGES/DRESSINGS) IMPLANT
GAUZE SPONGE 4X4 16PLY XRAY LF (GAUZE/BANDAGES/DRESSINGS) ×6 IMPLANT
GLOVE BIOGEL PI IND STRL 7.5 (GLOVE) ×1 IMPLANT
GLOVE BIOGEL PI INDICATOR 7.5 (GLOVE) ×2
GLOVE ECLIPSE 7.0 STRL STRAW (GLOVE) ×6 IMPLANT
GLOVE ECLIPSE 7.5 STRL STRAW (GLOVE) ×6 IMPLANT
GLOVE EXAM NITRILE LRG STRL (GLOVE) IMPLANT
GLOVE EXAM NITRILE XL STR (GLOVE) IMPLANT
GLOVE EXAM NITRILE XS STR PU (GLOVE) IMPLANT
GLOVE INDICATOR 7.5 STRL GRN (GLOVE) ×6 IMPLANT
GOWN STRL REUS W/ TWL LRG LVL3 (GOWN DISPOSABLE) ×4 IMPLANT
GOWN STRL REUS W/ TWL XL LVL3 (GOWN DISPOSABLE) IMPLANT
GOWN STRL REUS W/TWL 2XL LVL3 (GOWN DISPOSABLE) IMPLANT
GOWN STRL REUS W/TWL LRG LVL3 (GOWN DISPOSABLE) ×8
GOWN STRL REUS W/TWL XL LVL3 (GOWN DISPOSABLE)
HEMOSTAT POWDER KIT SURGIFOAM (HEMOSTASIS) ×6 IMPLANT
KIT BASIN OR (CUSTOM PROCEDURE TRAY) ×3 IMPLANT
KIT INFUSE SMALL (Orthopedic Implant) ×3 IMPLANT
KIT POSITION SURG JACKSON T1 (MISCELLANEOUS) ×3 IMPLANT
KIT ROOM TURNOVER OR (KITS) ×3 IMPLANT
KIT SPINE MAZOR X ROBO DISP (MISCELLANEOUS) ×2 IMPLANT
NEEDLE HYPO 18GX1.5 BLUNT FILL (NEEDLE) IMPLANT
NEEDLE HYPO 25X1 1.5 SAFETY (NEEDLE) ×3 IMPLANT
NEEDLE SPNL 18GX3.5 QUINCKE PK (NEEDLE) IMPLANT
NS IRRIG 1000ML POUR BTL (IV SOLUTION) ×3 IMPLANT
OIL CARTRIDGE MAESTRO DRILL (MISCELLANEOUS) ×3
PACK LAMINECTOMY NEURO (CUSTOM PROCEDURE TRAY) ×3 IMPLANT
PAD ARMBOARD 7.5X6 YLW CONV (MISCELLANEOUS) ×12 IMPLANT
PATTIES SURGICAL 1X1 (DISPOSABLE) ×3 IMPLANT
PIN HEAD 2.5X60MM (PIN) IMPLANT
ROD 90MM (Rod) ×4 IMPLANT
ROD SPNL CVD 90X4.75X (Rod) ×2 IMPLANT
SCREW 5.5X35MM (Screw) ×8 IMPLANT
SCREW BN 35X5.5XMA NS SPNE (Screw) ×4 IMPLANT
SCREW SCHANZ SA 4.0MM ×2 IMPLANT
SCREW SET SOLERA (Screw) ×16 IMPLANT
SCREW SET SOLERA TI (Screw) ×8 IMPLANT
SPONGE LAP 4X18 X RAY DECT (DISPOSABLE) IMPLANT
SPONGE SURGIFOAM ABS GEL 100 (HEMOSTASIS) ×3 IMPLANT
STRIP CLOSURE SKIN 1/2X4 (GAUZE/BANDAGES/DRESSINGS) IMPLANT
SUT VIC AB 0 CT1 18XCR BRD8 (SUTURE) ×3 IMPLANT
SUT VIC AB 0 CT1 8-18 (SUTURE) ×6
SUT VICRYL 3-0 RB1 18 ABS (SUTURE) ×12 IMPLANT
SYR 3ML LL SCALE MARK (SYRINGE) IMPLANT
TOWEL OR 17X24 6PK STRL BLUE (TOWEL DISPOSABLE) ×3 IMPLANT
TOWEL OR 17X26 10 PK STRL BLUE (TOWEL DISPOSABLE) ×3 IMPLANT
TRAP SPECIMEN MUCOUS 40CC (MISCELLANEOUS) ×3 IMPLANT
TRAY FOLEY W/METER SILVER 16FR (SET/KITS/TRAYS/PACK) ×3 IMPLANT
TUBE MAZOR SA REDUCTION (TUBING) ×2 IMPLANT
WATER STERILE IRR 1000ML POUR (IV SOLUTION) ×3 IMPLANT

## 2016-10-28 NOTE — Anesthesia Preprocedure Evaluation (Addendum)
Anesthesia Evaluation  Patient identified by MRN, date of birth, ID band Patient awake    Reviewed: Allergy & Precautions, H&P , NPO status , Patient's Chart, lab work & pertinent test results  Airway Mallampati: III  TM Distance: >3 FB Neck ROM: Full    Dental no notable dental hx. (+) Teeth Intact, Dental Advisory Given   Pulmonary sleep apnea and Continuous Positive Airway Pressure Ventilation , former smoker,    Pulmonary exam normal breath sounds clear to auscultation       Cardiovascular hypertension, Pt. on medications  Rhythm:Regular Rate:Normal     Neuro/Psych negative neurological ROS  negative psych ROS   GI/Hepatic Neg liver ROS, GERD  Medicated and Controlled,  Endo/Other  Morbid obesity  Renal/GU negative Renal ROS  negative genitourinary   Musculoskeletal  (+) Arthritis , Osteoarthritis,    Abdominal   Peds  Hematology negative hematology ROS (+) anemia ,   Anesthesia Other Findings   Reproductive/Obstetrics negative OB ROS                            Anesthesia Physical Anesthesia Plan  ASA: III  Anesthesia Plan: General   Post-op Pain Management:    Induction: Intravenous  Airway Management Planned: Oral ETT  Additional Equipment:   Intra-op Plan:   Post-operative Plan: Extubation in OR and Possible Post-op intubation/ventilation  Informed Consent: I have reviewed the patients History and Physical, chart, labs and discussed the procedure including the risks, benefits and alternatives for the proposed anesthesia with the patient or authorized representative who has indicated his/her understanding and acceptance.   Dental advisory given  Plan Discussed with: CRNA  Anesthesia Plan Comments:         Anesthesia Quick Evaluation

## 2016-10-28 NOTE — Transfer of Care (Signed)
Immediate Anesthesia Transfer of Care Note  Patient: [name]  Procedure(s) Performed: Procedure(s) with comments: POSTERIOR LUMBAR INTERBODY FUSION LUMBAR THREE-LUMBAR FOUR, EXTENSION OF LUMBAR FIVE- SACRAL ONE FUSION with Mazor (N/A) - POSTERIOR LUMBAR INTERBODY FUSION L3-L4, EXTENSION OF L5-S1 FUSION APPLICATION OF ROBOTIC ASSISTANCE FOR SPINAL PROCEDURE (N/A)  Patient Location: PACU  Anesthesia Type:General  Level of Consciousness: awake and patient cooperative, drowsy  Airway & Oxygen Therapy: Patient Spontanous Breathing and Patient connected to face mask oxygen, nasal trumpet  Post-op Assessment: Report given to RN and Post -op Vital signs reviewed and stable  Post vital signs: Reviewed and stable  Last Vitals:  Vitals:   10/28/16 0802  BP: (!) 175/94  Pulse: 87  Resp: 20  Temp: 37 C    Last Pain:  Vitals:   10/28/16 0802  TempSrc: Oral         Complications: No apparent anesthesia complications

## 2016-10-28 NOTE — Progress Notes (Signed)
Patient admitted to 5M20. Patient is drowsy, easily aroused, oriented x4, honeycomb dressing has no drainage, skin otherwise intact, patient on 2L .   [employer] paged for brace.

## 2016-10-28 NOTE — Op Note (Signed)
PREOP DIAGNOSIS:  1. Lumbar spinal stenosis, L3-4 2. Spondylolisthesis, L3-4  POSTOP DIAGNOSIS: Same  PROCEDURE: 1. L3-4 laminectomy with facetectomy for decompression of exiting nerve roots, more than would be required for placement of interbody graft 2. Placement of anterior interbody device - [employer]tronic expandable 7mm cage x2 3. Posterior instrumentation using cortical pedicle screws at L3 - L4 4. Interbody arthrodesis, L3-4 5. Posterolateral arthrodesis, L4-5 5. Use of locally harvested bone autograft 6. Use of non-structural bone allograft - BMP 7. Removal of previous rod from L5-S1 8. Use of robotic assistance for pedicle screw placement  SURGEON: Dr. Haward Pope, MD  ASSILisbeth RenshawSTANT: Dr. Cherrie DistanceBenjamin Ditty, MD  ANESTHESIA: General Endotracheal  EBL: 1600cc  SPECIMENS: None  DRAINS: None  COMPLICATIONS: None immediate  CONDITION: Hemodynamically stable to PACU  HISTORY: Raymond GrewRandy D Calleros is a 61 y.o. male who has been followed in the outpatient clinic with back and leg pain related which has developed since he underwent previous L5-S1 fusion 8 months ago. CT myelogram demonstrated stenosis with spondylolisthesis at L3-4. She attempted multiple conservative treatments and ultimately elected to proceed with surgical decompression and fusion. Risks and benefits were reviewed and consent was obtained.  PROCEDURE IN DETAIL: After informed consent was obtained and witnessed, the patient was brought to the operating room. After induction of general anesthesia, the patient was positioned on the operative table in the prone position. All pressure points were meticulously padded. Incision was then marked out and prepped and draped in the usual sterile fashion.  After timeout was conducted, skin was infiltrated with local anesthetic. Previous skin incision was then made sharply and extended superiorly. Bovie electrocautery was used to dissect the subcutaneous tissue until the lumbodorsal  fascia was identified and incised. The muscle was then elevated in the subperiosteal plane and the L3 and L4 lamina were identified as was the previous instrumentation at L5 and S1. Self-retaining retractors were then placed.  The Mazor robotic system was then used to drill and tap L3 and L4 cortical pedicle screws.   At this point attention was turned to decompression. Complete L3 laminectomy with facetectomy was completed using a combination of Kerrison rongeurs and a high-speed drill. Good decompression of the exiting and traversing roots was confirmed.  Disc space was then identified, incised, and using a combination of shavers, curettes and rongeurs, complete discectomy was completed. Endplates were prepared, and bilateral 7mm expandable cages were tapped into place.  Bone harvested during decompression was mixed with BMP and packed into the interspace. Good position was confirmed with fluoroscopy.  At this point, the L3 and L4 cortical pedicle screws were placed. The left L4 pilot hole appeared to have breached medially and was redirected laterally. The previous set screws at L5 and S1 were removed and the rod was removed. A new rod was then placed spanning L3-S1, set screws placed and final tightened. Final AP and lateral fluoroscopic images confirmed good position.  The lamina at L4 as well as the L4-5 facet complex was decorticated including the facet joint using the high-speed drill in preparation for fusion. The BMP was then placed over the bone surface and autograft was placed for posterior arthrodesis.  The wound was then irrigated with copious amounts of antibiotic saline, then closed in standard fashion using a combination of interrupted 0 and 3-0 Vicryl stitches in the muscular, fascial, and subcutaneous layers. Skin was then closed using standard Dermabond. Sterile dressing was then applied. The patient was then transferred to the stretcher, extubated, and taken  to the postanesthesia  care unit in stable hemodynamic condition.  At the end of the case all sponge, needle, cottonoid, and instrument counts were correct.

## 2016-10-28 NOTE — Anesthesia Procedure Notes (Signed)
Procedure Name: Intubation Date/Time: 10/28/2016 10:10 AM Performed by: Merdis Delay Pre-anesthesia Checklist: Patient identified, Emergency Drugs available, Suction available, Patient being monitored and Timeout performed Patient Re-evaluated:Patient Re-evaluated prior to inductionOxygen Delivery Method: Circle system utilized Preoxygenation: Pre-oxygenation with 100% oxygen Intubation Type: IV induction Ventilation: Mask ventilation with difficulty and Two handed mask ventilation required Laryngoscope Size: Mac and 4 Grade View: Grade II Tube type: Oral Tube size: 8.0 mm Number of attempts: 1 Airway Equipment and Method: Stylet Placement Confirmation: ETT inserted through vocal cords under direct vision,  positive ETCO2,  CO2 detector and breath sounds checked- equal and bilateral Secured at: 22 cm Tube secured with: Tape Dental Injury: Teeth and Oropharynx as per pre-operative assessment  Comments: Base of arytenoids visible

## 2016-10-28 NOTE — H&P (Signed)
CC:  Back and leg pain  HPI:  [name] is a 61 year old man I'm seeing for the above. He has undergone L5-S1 interbody fusion back in July of t2017. Unfortunately, postoperatively he has had continued back and left greater than right leg and foot pain. He says the pain in his left leg is significantly exacerbated whenever he is standing or walking for a period of time. He does also have fairly constant low back pain, especially with activity. He was initially on Neurontin which helped somewhat, but he continues to have significant amount of pain limiting his daily activities.       PMH: Past Medical History:  Diagnosis Date  . 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 Arthritis   . Constipation due to pain medication   . GERD (gastroesophageal reflux disease)   . H/O staphylococcal infection   . History of hiatal hernia   . Hypertension   . Neuropathy (HCC)   . Sleep apnea    CPAP     PSH: Past Surgical History:  Procedure Laterality Date  . ANTERIOR CRUCIATE LIGAMENT REPAIR     right  . arthroscopy     right knee  . BACK SURGERY     x 3  . CARDIAC CATHETERIZATION  2005   normal per pt.  . COLONOSCOPY WITH PROPOFOL N/A 02/21/2014   Procedure: COLONOSCOPY WITH PROPOFOL;  Surgeon: Theda Belfast, MD;  Location: [facility];  Service: Endoscopy;  Laterality: N/A;  . JOINT REPLACEMENT     right knee  . MENISCUS REPAIR Left    knee    SH: Social History  Substance Use Topics  . Smoking status: Former Smoker    Types: Cigarettes    Quit date: 02/29/2004  . Smokeless tobacco: Never Used  . Alcohol use Yes     Comment: occasionally    MEDS: Prior to Admission medications   Medication Sig Start Date End Date Taking? Authorizing Provider  cephALEXin (KEFLEX) 500 MG capsule Take 500 mg by mouth daily. Take every day to prevent return of staff in infection   Yes Historical Provider, MD  DULoxetine (CYMBALTA) 60 MG capsule Take 60 mg by mouth daily.   Yes  Historical Provider, MD  fluticasone (FLONASE) 50 MCG/ACT nasal spray Place 1 spray into both nostrils daily as needed for allergies.    Yes Historical Provider, MD  gabapentin (NEURONTIN) 300 MG capsule Take 900 mg by mouth 3 (three) times daily.    Yes Historical Provider, MD  GLUCOSAMINE HCL-MSM PO Take 1,500 mg by mouth 2 (two) times daily.   Yes Historical Provider, MD  HYDROcodone-acetaminophen (NORCO) 10-325 MG tablet Take 1 tablet by mouth every 6 (six) hours as needed for moderate pain.    Yes Historical Provider, MD  lisinopril-hydrochlorothiazide (PRINZIDE,ZESTORETIC) 20-12.5 MG per tablet Take 1 tablet by mouth every morning.   Yes Historical Provider, MD  meloxicam (MOBIC) 15 MG tablet Take 15 mg by mouth daily.   Yes Historical Provider, MD  Multiple Vitamin (MULTIVITAMIN WITH MINERALS) TABS tablet Take 1 tablet by mouth daily.   Yes Historical Provider, MD  omeprazole (PRILOSEC) 40 MG capsule Take 40 mg by mouth daily.    Yes Historical Provider, MD  simvastatin (ZOCOR) 20 MG tablet Take 20 mg by mouth at bedtime.   Yes Historical Provider, MD  aspirin 325 MG EC tablet Take 325 mg by mouth daily. Stopped tasking prior to procedure    Historical Provider, MD  triamcinolone  cream (KENALOG) 0.1 % Apply 1 application topically daily as needed (for skin care on ears).  11/12/15   Historical Provider, MD    ALLERGY: Allergies  Allergen Reactions  . Penicillins Swelling    SWELLING OF FACE (when patient was a small child)  Has patient had a PCN reaction causing immediate rash, facial/tongue/throat swelling, SOB or lightheadedness with hypotension: YES PCN reaction causing severe rash involving mucus membranes or skin necrosis:NO PCN reaction that required hospitalization NO PCN reaction occurring within the last 10 years: NO   . Iodinated Diagnostic Agents Rash    Iohexol; during cardiac cath; resolved with Benadryl    ROS: ROS  NEUROLOGIC EXAM: Awake, alert, oriented Memory  and concentration grossly intact Speech fluent, appropriate CN grossly intact Motor exam: Upper Extremities Deltoid Bicep Tricep Grip  Right 5/5 5/5 5/5 5/5  Left 5/5 5/5 5/5 5/5   Lower Extremity IP Quad PF DF EHL  Right 5/5 5/5 5/5 5/5 5/5  Left 5/5 5/5 5/5 5/5 5/5   Sensation grossly intact to LT  [facility] CT myelogram was reviewed which demonstrates good position of the L5 and S1 screws. There is good position of the interbody grafts. There is mild stenosis at L4 L5. There is moderate stenosis at L3 L4 including central and lateral recess/foraminal stenosis. In addition, there has been discussed desiccation with vacuum disc phenomenon, and development of 4 mm retrolisthesis. The disc degeneration, retrolisthesis, and stenosis all appear to be progressive since the last CT myelogram of March 2016. 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 He is approximately 5 months status post L5-S1 fusion, and has demonstrated progressive worsening in the disc degeneration, spondylolisthesis, and stenosis at L3 L4.   PLAN: Will plan on extending his L5-S1 fusion with decompression and interbody fusion at L3 L4. The L4 L5 disc appears okay, and we would likely decompress at L4 L5, and extend the pedicle screw and rod construct from L3 to S1.  I did review the CT myelogram findings with the patient. Treatment options were discussed extensively in the office, including continued conservative treatment which might include epidural steroid injections, more physical therapy, versus surgical decompression and extension of fusion.   I have explained to the patient the details of the procedure, as well as the risks which include but are not limited to nerve root  injury leading to leg or foot weakness/numbness and/or bowel and bladder dysfunction, CSF leak, bleeding, and infection. Possible outcomes of surgery were also discussed including the possibility of persistence or worsening of pain symptoms and the possiblity of accelerated adjacent level degeneration. The general risks of anesthesia were also reviewed including heart attack, stroke, and DVT/PE.   The patient understood our discussion and is willing to proceed with surgical decompression and fusion. All questions were answered.

## 2016-10-28 NOTE — Progress Notes (Signed)
Vancomycin per Pharmacy Indication: surgical prophylaxis No hemovac or drain present Vancomycin 2 g IV x1 12 hours post-op  Baldemar FridayMasters, Marleen Moret M  10/28/2016 5:59 PM

## 2016-10-28 NOTE — Anesthesia Postprocedure Evaluation (Addendum)
Anesthesia Post Note  Patient: Esau GrewRandy D Christiano  Procedure(s) Performed: Procedure(s) (LRB): POSTERIOR LUMBAR INTERBODY FUSION LUMBAR THREE-LUMBAR FOUR, EXTENSION OF LUMBAR FIVE- SACRAL ONE FUSION with Mazor (N/A) APPLICATION OF ROBOTIC ASSISTANCE FOR SPINAL PROCEDURE (N/A)  Patient location during evaluation: PACU Anesthesia Type: General Level of consciousness: awake and alert Pain management: pain level controlled Vital Signs Assessment: post-procedure vital signs reviewed and stable Respiratory status: spontaneous breathing, nonlabored ventilation, respiratory function stable and patient connected to nasal cannula oxygen Cardiovascular status: blood pressure returned to baseline and stable Postop Assessment: no signs of nausea or vomiting Anesthetic complications: no       Last Vitals:  Vitals:   10/28/16 1655 10/28/16 1730  BP: 134/89 (!) 167/77  Pulse: (!) 109   Resp: (!) 28   Temp:      Last Pain:  Vitals:   10/28/16 1730  TempSrc:   PainSc: Asleep                 Artavia Jeanlouis S

## 2016-10-28 NOTE — Progress Notes (Signed)
Orthopedic Tech Progress Note Patient Details:  [name] 03/15/1956 [id number] Called [employer] for brace, will be delivered in morning 10/29/2016. Patient ID: [name], male   DOB: 10/08/1955, 61 y.o.   MRN: [id number]   [name], [name] Craig 10/28/2016, 7:41 PM

## 2016-10-29 LAB — BASIC METABOLIC PANEL
Anion gap: 13 (ref 5–15)
BUN: 15 mg/dL (ref 6–20)
CHLORIDE: 97 mmol/L — AB (ref 101–111)
CO2: 25 mmol/L (ref 22–32)
Calcium: 9.2 mg/dL (ref 8.9–10.3)
Creatinine, Ser: 1.23 mg/dL (ref 0.61–1.24)
GFR calc non Af Amer: >60 mL/min (ref >60)
Glucose, Bld: 154 mg/dL — ABNORMAL HIGH (ref 65–99)
POTASSIUM: 4.5 mmol/L (ref 3.5–5.1)
SODIUM: 135 mmol/L (ref 135–145)

## 2016-10-29 LAB — POCT I-STAT 4, (NA,K, GLUC, HGB,HCT)
Glucose, Bld: 149 mg/dL — ABNORMAL HIGH (ref 65–99)
HCT: 36 % — ABNORMAL LOW (ref 39.0–52.0)
Hemoglobin: 12.2 g/dL — ABNORMAL LOW (ref 13.0–17.0)
POTASSIUM: 4.4 mmol/L (ref 3.5–5.1)
SODIUM: 137 mmol/L (ref 135–145)

## 2016-10-29 LAB — CBC
HEMATOCRIT: 33.6 % — AB (ref 39.0–52.0)
HEMOGLOBIN: 10.9 g/dL — AB (ref 13.0–17.0)
MCH: 30.8 pg (ref 26.0–34.0)
MCHC: 32.4 g/dL (ref 30.0–36.0)
MCV: 94.9 fL (ref 78.0–100.0)
Platelets: 219 10*3/uL (ref 150–400)
RBC: 3.54 MIL/uL — AB (ref 4.22–5.81)
RDW: 13.9 % (ref 11.5–15.5)
WBC: 14.8 10*3/uL — ABNORMAL HIGH (ref 4.0–10.5)

## 2016-10-29 NOTE — Progress Notes (Signed)
New CPAP set up and patient placed on via [facility] (from home) auto titrate settings (max 20.0, min 5.0) cn H20 with 3 lpm O2 bleed in.  Tolerating well at this time. RN aware.

## 2016-10-29 NOTE — Progress Notes (Signed)
Vitals:   10/28/16 2139 10/29/16 0108 10/29/16 0141 10/29/16 0534  BP: 135/74  115/68 117/77  Pulse: (!) 106 (!) 105 100 96  Resp: 20 20 20 20   Temp: 98 F (36.7 C)  98.9 F (37.2 C) 99 F (37.2 C)  TempSrc: Oral  Oral Oral  SpO2: 98% 97% 98% 98%  Weight:        CBC  Recent Labs  10/28/16 0752 10/29/16 0604  WBC 7.7 14.8*  HGB 15.6 10.9*  HCT 46.7 33.6*  PLT 251 219   BMET  Recent Labs  10/28/16 0752 10/29/16 0604  NA 137 135  K 3.6 4.5  CL 101 97*  CO2 23 25  GLUCOSE 99 154*  BUN 16 15  CREATININE 1.07 1.23  CALCIUM 10.0 9.2    Patient up and ambulating in the halls with physical therapy, using a rolling walker. Reasonably comfortable with Percocet. Nursing reports some bloody staining of honeycomb dressing, but it remains intact. Have instructed her to change to another honeycomb, if it becomes necessary. Using incentive spirometry while in bed.  Plan: Encouraged to ambulate with a staff. Continue to progress through postoperative recovery.  Hewitt ShortsNUDELMAN,ROBERT W, MD 10/29/2016, 9:38 AM

## 2016-10-29 NOTE — Progress Notes (Signed)
RT spoke to patient about wearing CPAP and the patient asked RT to fill water chamber and he would apply the CPAP himself.  RT filled water chamber and pushed machine closer to bed for patient .

## 2016-10-29 NOTE — Evaluation (Signed)
Occupational Therapy Evaluation and Discharge Patient Details Name: [name] MRN: [id number] DOB: 1955/11/18 Today's Date: 10/29/2016    History of Present Illness 61 y.o. male s/p PLIF L3-S1. PMH consists of PLIF L5-S1 (03/2016), R TKA, HTN, and anemia.   Clinical Impression   Pt reports he was independent with ADL PTA. Currently pt supervision with ADL and functional mobility. All back, safety, and ADL education completed with pt. Pt planning to d/c home with 24/7 supervision from family initially. No further acute OT needs identified; signing off at this time. Please re-consult if needs change. Thank you for this referral.    Follow Up Recommendations  No OT follow up;Supervision/Assistance - 24 hour (initially)    Equipment Recommendations  None recommended by OT    Recommendations for Other Services       Precautions / Restrictions Precautions Precautions: Back Precaution Booklet Issued: Yes (comment) Precaution Comments: Pt able to recall 2/3 back precautions. Reviewed all precautions with pt. Required Braces or Orthoses: Spinal Brace Spinal Brace: Lumbar corset;Applied in standing position Restrictions Weight Bearing Restrictions: No      Mobility Bed Mobility Overal bed mobility: Needs Assistance Bed Mobility: Rolling;Sidelying to Sit;Sit to Sidelying Rolling: Supervision Sidelying to sit: Supervision     Sit to sidelying: Supervision General bed mobility comments: Supervision for safety. Pt with good log roll technique. HOB flat with use of bed rail  Transfers Overall transfer level: Needs assistance Equipment used: Rolling walker (2 wheeled) Transfers: Sit to/from Stand Sit to Stand: Supervision         General transfer comment: Good hand placement and technique. Supervision for safety    Balance Overall balance assessment: Needs assistance Sitting-balance support: Feet supported;No upper extremity supported Sitting balance-Leahy Scale:  Good     Standing balance support: Bilateral upper extremity supported Standing balance-Leahy Scale: Fair                              ADL Overall ADL's : Needs assistance/impaired Eating/Feeding: Independent;Sitting   Grooming: Supervision/safety;Standing Grooming Details (indicate cue type and reason): Educated on use of 2 cups for oral care Upper Body Bathing: Set up;Supervision/ safety;Sitting   Lower Body Bathing: Set up;Supervison/ safety;Sit to/from stand   Upper Body Dressing : Set up;Supervision/safety;Sitting;Standing Upper Body Dressing Details (indicate cue type and reason): to don/doff back brace Lower Body Dressing: Supervision/safety;Sit to/from stand Lower Body Dressing Details (indicate cue type and reason): Pt able to rest foot on EOB to don/doff LB cothing. Educated on compensatory strategies for LB ADL. Toilet Transfer: Supervision/safety;Ambulation;Regular Toilet;RW Toilet Transfer Details (indicate cue type and reason): Simulated by sit to stand from EOB with functional mobility in room   Toileting - Clothing Manipulation Details (indicate cue type and reason): Educated on proper technique for pericare without twisting and use of wet wipes Tub/ Shower Transfer: Min guard;Tub transfer;Ambulation;Rolling walker Tub/Shower Transfer Details (indicate cue type and reason): Simulated tub transfer Functional mobility during ADLs: Supervision/safety;Rolling walker General ADL Comments: Educated pt on maintaining back precautions during functional activities, keeping frequently used items at counter top height, log roll technique for bed mobility, frequent mobility throughout the day upon return home.     Vision     Perception     Praxis      Pertinent Vitals/Pain Pain Assessment: 0-10 Pain Score: 8  Pain Location: back Pain Descriptors / Indicators: Aching;Sore Pain Intervention(s): Monitored during session;Patient requesting pain meds-RN notified  Hand Dominance Right   Extremity/Trunk Assessment Upper Extremity Assessment Upper Extremity Assessment: Overall WFL for tasks assessed   Lower Extremity Assessment Lower Extremity Assessment: Defer to PT evaluation   Cervical / Trunk Assessment Cervical / Trunk Assessment: Other exceptions Cervical / Trunk Exceptions: s/p spinal sx   Communication Communication Communication: No difficulties   Cognition Arousal/Alertness: Awake/alert Behavior During Therapy: WFL for tasks assessed/performed Overall Cognitive Status: Within Functional Limits for tasks assessed                     General Comments       Exercises       Shoulder Instructions      Home Living Family/patient expects to be discharged to:: Private residence Living Arrangements: Alone Available Help at Discharge: Family;Available 24 hours/day (son to stay with pt initially) Type of Home: House Home Access: Stairs to enter Entergy CorporationEntrance Stairs-Number of Steps: 2 Entrance Stairs-Rails: None Home Layout: One level     Bathroom Shower/Tub: Tub/shower unit Shower/tub characteristics: Curtain FirefighterBathroom Toilet: Handicapped height     Home Equipment: Environmental consultantWalker - 2 wheels;Bedside commode          Prior Functioning/Environment Level of Independence: Independent                 OT Problem List:     OT Treatment/Interventions:      OT Goals(Current goals can be found in the care plan section) Acute Rehab OT Goals Patient Stated Goal: home tomorrow OT Goal Formulation: All assessment and education complete, DC therapy  OT Frequency:     Barriers to D/C:            Co-evaluation              End of Session Equipment Utilized During Treatment: Rolling walker;Back brace Nurse Communication: Patient requests pain meds;Mobility status  Activity Tolerance: Patient tolerated treatment well Patient left: in bed;with call bell/phone within reach;with family/visitor present   Time:  [id number] OT Time Calculation (min): 23 min Charges:  OT General Charges $OT Visit: 1 Procedure OT Evaluation $OT Eval Moderate Complexity: 1 Procedure OT Treatments $Self Care/Home Management : 8-22 mins G-Codes:     Gaye AlkenBailey A Rohen Kimes M.S., OTR/L Pager: [phone]  10/29/2016, 3:03 PM

## 2016-10-29 NOTE — Evaluation (Signed)
Physical Therapy Evaluation Patient Details Name: Raymond GrewRandy D Wolfe MRN: [id number] DOB: 05/28/1956 Today's Date: 10/29/2016   History of Present Illness  61 y.o. male s/p PLIF L3-S1. PMH consists of PLIF L5-S1 (03/2016), R TKA, HTN, and anemia.  Clinical Impression  Patient is s/p above surgery resulting in the deficits listed below (see PT Problem List). On eval, pt required min guard assist for all functional mobility, including ambulation 200 feet with RW. Pt reports BUE weakness. Patient will benefit from skilled PT to increase their independence and safety with mobility (while adhering to their precautions) to allow discharge to the venue listed below. PT to follow acutely. No f/u services indicated. Pt has RW for home.     Follow Up Recommendations No PT follow up;Supervision for mobility/OOB    Equipment Recommendations  None recommended by PT    Recommendations for Other Services       Precautions / Restrictions Precautions Precautions: Back Precaution Comments: Educated/reviewed 3/3 back precautions. Pt still remembers precautions from previous back sx. Required Braces or Orthoses: Spinal Brace Spinal Brace: Lumbar corset;Applied in standing position      Mobility  Bed Mobility Overal bed mobility: Needs Assistance Bed Mobility: Supine to Sit     Supine to sit: Min guard     General bed mobility comments: verbal cues for logroll, +rail  Transfers Overall transfer level: Needs assistance Equipment used: Rolling walker (2 wheeled) Transfers: Sit to/from Stand Sit to Stand: Min guard         General transfer comment: verbal cues for hand placement  Ambulation/Gait Ambulation/Gait assistance: Min guard Ambulation Distance (Feet): 200 Feet Assistive device: Rolling walker (2 wheeled) Gait Pattern/deviations: Step-through pattern;Decreased stride length Gait velocity: decreased Gait velocity interpretation: Below normal speed for age/gender General Gait  Details: slow, steady gait; O2 sats 97% on RA during ambulation.  Stairs            Wheelchair Mobility    Modified Rankin (Stroke Patients Only)       Balance Overall balance assessment: Needs assistance Sitting-balance support: No upper extremity supported;Feet supported Sitting balance-Leahy Scale: Good     Standing balance support: Bilateral upper extremity supported;During functional activity Standing balance-Leahy Scale: Fair                               Pertinent Vitals/Pain Pain Assessment: 0-10 Pain Score: 6  Pain Location: sx site Pain Descriptors / Indicators: Operative site guarding;Sore Pain Intervention(s): Monitored during session;Premedicated before session;Repositioned    Home Living Family/patient expects to be discharged to:: Private residence Living Arrangements: Alone Available Help at Discharge: Family;Available 24 hours/day (son will stay with pt at d/c) Type of Home: House Home Access: Stairs to enter Entrance Stairs-Rails: None Entrance Stairs-Number of Steps: 2 Home Layout: One level Home Equipment: Environmental consultantWalker - 2 wheels      Prior Function Level of Independence: Independent               Hand Dominance   Dominant Hand: Right    Extremity/Trunk Assessment   Upper Extremity Assessment Upper Extremity Assessment: Defer to OT evaluation    Lower Extremity Assessment Lower Extremity Assessment: Overall WFL for tasks assessed    Cervical / Trunk Assessment Cervical / Trunk Assessment: Normal  Communication   Communication: No difficulties  Cognition Arousal/Alertness: Awake/alert Behavior During Therapy: WFL for tasks assessed/performed Overall Cognitive Status: Within Functional Limits for tasks assessed  General Comments      Exercises     Assessment/Plan    PT Assessment Patient needs continued PT services  PT Problem List Decreased activity tolerance;Decreased  balance;Decreased mobility;Pain;Decreased knowledge of precautions          PT Treatment Interventions DME instruction;Gait training;Stair training;Functional mobility training;Balance training;Therapeutic activities;Patient/family education    PT Goals (Current goals can be found in the Care Plan section)  Acute Rehab PT Goals Patient Stated Goal: independence PT Goal Formulation: With patient Time For Goal Achievement: 11/05/16 Potential to Achieve Goals: Good    Frequency Min 5X/week   Barriers to discharge        Co-evaluation               End of Session Equipment Utilized During Treatment: Gait belt Activity Tolerance: Patient tolerated treatment well Patient left: in chair;with call bell/phone within reach Nurse Communication: Mobility status         Time: 1610-9604 PT Time Calculation (min) (ACUTE ONLY): 24 min   Charges:   PT Evaluation $PT Eval Moderate Complexity: 1 Procedure PT Treatments $Gait Training: 8-22 mins   PT G Codes:        Ilda Foil 10/29/2016, 10:16 AM

## 2016-10-30 MED ORDER — METHOCARBAMOL 500 MG PO TABS: 500.0000 mg | ORAL_TABLET | Freq: Four times a day (QID) | ORAL | 1 refills | Status: DC | PRN

## 2016-10-30 MED ORDER — OXYCODONE-ACETAMINOPHEN 5-325 MG PO TABS: 1.0000 | ORAL_TABLET | ORAL | 0 refills | Status: DC | PRN

## 2016-10-30 NOTE — Progress Notes (Signed)
Physical Therapy Treatment Patient Details Name: Raymond GrewRandy D Wolfe MRN: [id number] DOB: 05/08/1956 Today's Date: 10/30/2016    History of Present Illness 61 y.o. male s/p PLIF L3-S1. PMH consists of PLIF L5-S1 (03/2016), R TKA, HTN, and anemia.    PT Comments    Pt is making steady progress with mobility. Stair education completed today. Patient safe to D/C from a mobility standpoint based on progression towards goals set on PT eval.   Follow Up Recommendations  No PT follow up;Supervision for mobility/OOB     Equipment Recommendations  None recommended by PT    Precautions / Restrictions Precautions Precautions: Back Precaution Comments: pt able to recall 3/3 back precautions. pt independent with brace donning. Required Braces or Orthoses: Spinal Brace Spinal Brace: Lumbar corset;Applied in sitting position;Applied in standing position (initally on in sitting, adjusted in standing)    Mobility  Bed Mobility               General bed mobility comments: pt seated EOB on arrival.  Transfers Overall transfer level: Needs assistance Equipment used: Rolling walker (2 wheeled) Transfers: Sit to/from Stand Sit to Stand: Supervision         General transfer comment: demo'd safe technique with increased time needed.  Ambulation/Gait Ambulation/Gait assistance: Supervision Ambulation Distance (Feet): 100 Feet Assistive device: Rolling walker (2 wheeled) Gait Pattern/deviations: Step-through pattern;Decreased stride length;Trunk flexed Gait velocity: decreased Gait velocity interpretation: Below normal speed for age/gender General Gait Details: increased UE reliance on walker, slow and guarded gait speed. cues on posture and to relax shoulder and decrease reliance on walker.   Stairs Stairs: Yes   Stair Management: Step to pattern;One rail Right (one rail simulating using doorframe as pt will use at home) Number of Stairs: 2 General stair comments: cues on technique  and sequencing      Cognition Arousal/Alertness: Awake/alert Behavior During Therapy: WFL for tasks assessed/performed Overall Cognitive Status: Within Functional Limits for tasks assessed                       Pertinent Vitals/Pain Pain Assessment: 0-10 Pain Score: 7  Pain Location: back Pain Descriptors / Indicators: Aching;Sore;Operative site guarding Pain Intervention(s): Limited activity within patient's tolerance;Monitored during session;Premedicated before session;Repositioned     PT Goals (current goals can now be found in the care plan section) Acute Rehab PT Goals Patient Stated Goal: home tomorrow PT Goal Formulation: With patient Time For Goal Achievement: 11/05/16 Progress towards PT goals: Progressing toward goals    Frequency    Min 5X/week      PT Plan Current plan remains appropriate    End of Session Equipment Utilized During Treatment: Gait belt;Back brace Activity Tolerance: Patient tolerated treatment well;No increased pain Patient left: in bed;with call bell/phone within reach;with family/visitor present (EOB with son in room)     Time: 1212-1222 PT Time Calculation (min) (ACUTE ONLY): 10 min  Charges:  $Gait Training: 8-22 mins           Sallyanne KusterBury, Silena Wyss 10/30/2016, 12:33 PM   Sallyanne KusterKathy Ilah Boule, PTA, CLT Acute Rehab Services [phone] 10/30/16, 12:35 PM

## 2016-10-30 NOTE — Progress Notes (Signed)
Patient left unit in wheelchair accompanied by staff to private vehicle with all belongings.

## 2016-10-30 NOTE — Discharge Summary (Signed)
Date of Admission: 10/28/2016  Date of Discharge: 10/30/16  PREOP DIAGNOSIS:  1. Lumbar spinal stenosis, L3-4 2. Spondylolisthesis, L3-4  POSTOP DIAGNOSIS: Same  PROCEDURE: 1. L3-4 laminectomy with facetectomy for decompression of exiting nerve roots, more than would be required for placement of interbody graft 2. Placement of anterior interbody device - [employer] expandable 7mm cage x2 3. Posterior instrumentation using cortical pedicle screws at L3 - L4 4. Interbody arthrodesis, L3-4 5. Posterolateral arthrodesis, L4-5 5. Use of locally harvested bone autograft 6. Use of non-structural bone allograft - BMP 7. Removal of previous rod from L5-S1 8. Use of robotic assistance for pedicle screw placement  Attending: Lisbeth RenshawNeelesh Nundkumar, MD  Hospital Course:  The patient was admitted for the above listed operation and had an uncomplicated post-operative course.  They were discharged in stable condition. Able to move all extremities. Full strength. CN intact. Incision site with dressing with dried blood. No active bleeding or discharge.  Follow up: 3 weeks  Allergies as of 10/30/2016      Reactions   Penicillins Swelling   SWELLING OF FACE (when patient was a small child)  Has patient had a PCN reaction causing immediate rash, facial/tongue/throat swelling, SOB or lightheadedness with hypotension: YES PCN reaction causing severe rash involving mucus membranes or skin necrosis:NO PCN reaction that required hospitalization NO PCN reaction occurring within the last 10 years: NO   Iodinated Diagnostic Agents Rash   Iohexol; during cardiac cath; resolved with Benadryl      Medication List    STOP taking these medications   HYDROcodone-acetaminophen 10-325 MG tablet Commonly known as:  NORCO   meloxicam 15 MG tablet Commonly known as:  MOBIC     TAKE these medications   aspirin 325 MG EC tablet Take 325 mg by mouth daily. Stopped tasking prior to procedure   cephALEXin 500 MG  capsule Commonly known as:  KEFLEX Take 500 mg by mouth daily. Take every day to prevent return of staff in infection   DULoxetine 60 MG capsule Commonly known as:  CYMBALTA Take 60 mg by mouth daily.   fluticasone 50 MCG/ACT nasal spray Commonly known as:  FLONASE Place 1 spray into both nostrils daily as needed for allergies.   gabapentin 300 MG capsule Commonly known as:  NEURONTIN Take 900 mg by mouth 3 (three) times daily.   GLUCOSAMINE HCL-MSM PO Take 1,500 mg by mouth 2 (two) times daily.   lisinopril-hydrochlorothiazide 20-12.5 MG tablet Commonly known as:  PRINZIDE,ZESTORETIC Take 1 tablet by mouth every morning.   methocarbamol 500 MG tablet Commonly known as:  ROBAXIN Take 1 tablet (500 mg total) by mouth every 6 (six) hours as needed for muscle spasms.   multivitamin with minerals Tabs tablet Take 1 tablet by mouth daily.   omeprazole 40 MG capsule Commonly known as:  PRILOSEC Take 40 mg by mouth daily.   oxyCODONE-acetaminophen 5-325 MG tablet Commonly known as:  PERCOCET/ROXICET Take 1-2 tablets by mouth every 4 (four) hours as needed for moderate pain.   simvastatin 20 MG tablet Commonly known as:  ZOCOR Take 20 mg by mouth at bedtime.   triamcinolone cream 0.1 % Commonly known as:  KENALOG Apply 1 application topically daily as needed (for skin care on ears).

## 2016-10-30 NOTE — Discharge Instructions (Signed)
Spinal Fusion, Care After °Refer to this sheet in the next few weeks. These instructions provide you with information about caring for yourself after your procedure. Your health care provider may also give you more specific instructions. Your treatment has been planned according to current medical practices, but problems sometimes occur. Call your health care provider if you have any problems or questions after your procedure. °WHAT TO EXPECT AFTER THE PROCEDURE °After your procedure, it is common to have: °· Pain and stiffness in your back. °· Pain around your incision. °HOME CARE INSTRUCTIONS  °Medicines  °· Take over-the-counter and prescription medicines only as told by your health care provider. These include any pain medicines. °· Do not drive for 24 hours if you received a sedative. °· Do not drive or operate heavy machinery while taking prescription pain medicine. °· If you were prescribed an antibiotic medicine, take it as told by your health care provider. Do not stop taking the antibiotic even if you start to feel better. °Incision Care  °· Follow instructions from your health care provider about how to take care of your incision. Make sure you: °¨ Wash your hands with soap and water before you change your bandage (dressing). If soap and water are not available, use hand sanitizer. °¨ Change your dressing as told by your health care provider. °¨ Leave stitches (sutures), skin glue, or adhesive strips in place. These skin closures may need to be in place for 2 weeks or longer. If adhesive strip edges start to loosen and curl up, you may trim the loose edges. Do not remove adhesive strips completely unless your health care provider tells you to do that. °· Keep your incision clean and dry. Do not take baths, swim, or use a hot tub until your health care provider approves. °· Check your incision and the surrounding area every day for redness, swelling, and leaking fluid. °Physical Activity  °· Return to your  normal activities as told by your health care provider. Ask your health care provider what activities are safe for you. Rest and protect your back as much as possible. °· Follow instructions from your health care provider about how to move and use good posture to help your spine heal. °· Do not lift anything that is heavier than 8 lb (3.6 kg) or the limit that your health care provider tells you until he or she says that it is safe. Avoid lifting anything over your head. °· Do not twist or bend at the waist until your health care provider approves. °· Avoid pushing and pulling motions. °· Avoid sitting or lying down in the same position for long periods of time. °· Do not begin exercising until told by your health care provider. Ask your health care provider what kinds of exercise you can do to make your back stronger. °General Instructions  °· If you were given a brace, use it as told by your health care provider. °· Wear compression stockings as told by your health care provider. These stockings help to prevent blood clots and reduce swelling in your legs. °· Do not use tobacco products, including cigarettes, chewing tobacco, or e-cigarettes. If you need help quitting, ask your health care provider. °· Keep all follow-up visits as told by your health care provider and, if necessary, your physical therapist. This is important. °SEEK MEDICAL CARE IF: °· You have pain that gets worse or does not get better with medicine. °· Your legs or feet become painful or swollen. °· You have redness, swelling, or   pain at the site of your incision. °· You have fluid, blood, or pus coming from your incision. °· You vomit or feel nauseous. °· You have weakness or numbness in your legs that is new or getting worse. °· You have a fever. °· You have trouble controlling urination or bowel movements. °SEEK IMMEDIATE MEDICAL CARE IF:  °· You have severe pain. °· You have chest pain. °· You have trouble breathing. °· You develop a  cough. °These symptoms may represent a serious problem that is an emergency. Do not wait to see if the symptoms will go away. Get medical help right away. Call your local emergency services (911 in the [place] Do not drive yourself to the hospital.  °This information is not intended to replace advice given to you by your health care provider. Make sure you discuss any questions you have with your health care provider. °Document Released: 03/25/2005 Document Revised: 12/28/2015 Document Reviewed: 02/18/2015 °Elsevier Interactive Patient Education © 2017 [employer] ° °

## 2016-10-30 NOTE — Progress Notes (Signed)
Patient given discharge instructions with hardscripts.  All questions and answers addressed.  IV catheter removed without difficulty.

## 2016-10-30 NOTE — Care Management Note (Signed)
Case Management Note  Patient Details  Name: [name] MRN: [id number] Date of Birth: 01/27/1956  Subjective/Objective:                  L3-4 laminectomy with facetectomy Action/Plan: Discharge plannning Expected Discharge Date:  10/30/16               Expected Discharge Plan:  Home/Self Care  In-House Referral:     Discharge planning Services  CM Consult  Post Acute Care Choice:  Home Health, Durable Medical Equipment Choice offered to:  Patient  DME Arranged:  N/A DME Agency:  NA  HH Arranged:  NA HH Agency:  NA  Status of Service:  Completed, signed off  If discussed at Long Length of Stay Meetings, dates discussed:    Additional Comments: CM notes no HH services or DMe recc or ordered. No other CM needs were communicated. Yves DillJeffries, Nils Thor Christine, RN 10/30/2016, 8:36 AM

## 2016-10-31 MED FILL — Thrombin For Soln 5000 Unit: CUTANEOUS | Qty: 5000 | Status: AC

## 2016-11-02 ENCOUNTER — Encounter (HOSPITAL_COMMUNITY): Payer: Self-pay | Admitting: Neurosurgery

## 2016-11-21 ENCOUNTER — Other Ambulatory Visit: Payer: Self-pay | Admitting: Neurosurgery

## 2016-11-21 ENCOUNTER — Encounter (HOSPITAL_COMMUNITY): Payer: Self-pay | Admitting: *Deleted

## 2016-11-21 NOTE — Progress Notes (Signed)
Spoke with pt for pre-op call. Pt was here in February. Pt states nothing has changed with his allergies, medications, medical and surgical history.

## 2016-11-22 ENCOUNTER — Ambulatory Visit (HOSPITAL_COMMUNITY): Payer: Medicare Other | Admitting: Certified Registered Nurse Anesthetist

## 2016-11-22 ENCOUNTER — Encounter (HOSPITAL_COMMUNITY): Payer: Self-pay | Admitting: Certified Registered Nurse Anesthetist

## 2016-11-22 ENCOUNTER — Ambulatory Visit (HOSPITAL_COMMUNITY)
Admission: RE | Admit: 2016-11-22 | Discharge: 2016-11-22 | Disposition: A | Discharge status: Home / Self Care | Payer: Medicare Other | Source: Ambulatory Visit | Attending: Neurosurgery | Admitting: Neurosurgery

## 2016-11-22 ENCOUNTER — Encounter (HOSPITAL_COMMUNITY)
Admission: RE | Disposition: A | Discharge status: Home / Self Care | Payer: Self-pay | Source: Ambulatory Visit | Attending: Neurosurgery

## 2016-11-22 DIAGNOSIS — I10 Essential (primary) hypertension: Secondary | ICD-10-CM | POA: Insufficient documentation

## 2016-11-22 DIAGNOSIS — G629 Polyneuropathy, unspecified: Secondary | ICD-10-CM | POA: Diagnosis not present

## 2016-11-22 DIAGNOSIS — K449 Diaphragmatic hernia without obstruction or gangrene: Secondary | ICD-10-CM | POA: Diagnosis not present

## 2016-11-22 DIAGNOSIS — Z6841 Body Mass Index (BMI) 40.0 and over, adult: Secondary | ICD-10-CM | POA: Insufficient documentation

## 2016-11-22 DIAGNOSIS — M1990 Unspecified osteoarthritis, unspecified site: Secondary | ICD-10-CM | POA: Diagnosis not present

## 2016-11-22 DIAGNOSIS — Y838 Other surgical procedures as the cause of abnormal reaction of the patient, or of later complication, without mention of misadventure at the time of the procedure: Secondary | ICD-10-CM | POA: Diagnosis not present

## 2016-11-22 DIAGNOSIS — Z91041 Radiographic dye allergy status: Secondary | ICD-10-CM | POA: Diagnosis not present

## 2016-11-22 DIAGNOSIS — Z7982 Long term (current) use of aspirin: Secondary | ICD-10-CM | POA: Insufficient documentation

## 2016-11-22 DIAGNOSIS — Z96651 Presence of right artificial knee joint: Secondary | ICD-10-CM | POA: Diagnosis not present

## 2016-11-22 DIAGNOSIS — T814XXA Infection following a procedure, initial encounter: Secondary | ICD-10-CM | POA: Diagnosis not present

## 2016-11-22 DIAGNOSIS — Z87891 Personal history of nicotine dependence: Secondary | ICD-10-CM | POA: Insufficient documentation

## 2016-11-22 DIAGNOSIS — G4730 Sleep apnea, unspecified: Secondary | ICD-10-CM | POA: Diagnosis not present

## 2016-11-22 DIAGNOSIS — Z79899 Other long term (current) drug therapy: Secondary | ICD-10-CM | POA: Insufficient documentation

## 2016-11-22 DIAGNOSIS — K219 Gastro-esophageal reflux disease without esophagitis: Secondary | ICD-10-CM | POA: Diagnosis not present

## 2016-11-22 DIAGNOSIS — Z981 Arthrodesis status: Secondary | ICD-10-CM | POA: Diagnosis not present

## 2016-11-22 DIAGNOSIS — Z880 Allergy status to penicillin: Secondary | ICD-10-CM | POA: Diagnosis not present

## 2016-11-22 DIAGNOSIS — T8189XA Other complications of procedures, not elsewhere classified, initial encounter: Secondary | ICD-10-CM | POA: Diagnosis present

## 2016-11-22 HISTORY — PX: LUMBAR WOUND DEBRIDEMENT: SHX1988

## 2016-11-22 LAB — BASIC METABOLIC PANEL
ANION GAP: 12 (ref 5–15)
BUN: 18 mg/dL (ref 6–20)
CHLORIDE: 101 mmol/L (ref 101–111)
CO2: 20 mmol/L — ABNORMAL LOW (ref 22–32)
Calcium: 10 mg/dL (ref 8.9–10.3)
Creatinine, Ser: 1.51 mg/dL — ABNORMAL HIGH (ref 0.61–1.24)
GFR calc Af Amer: 56 mL/min — ABNORMAL LOW (ref >60)
GFR calc non Af Amer: 49 mL/min — ABNORMAL LOW (ref >60)
Glucose, Bld: 127 mg/dL — ABNORMAL HIGH (ref 65–99)
POTASSIUM: 4 mmol/L (ref 3.5–5.1)
Sodium: 133 mmol/L — ABNORMAL LOW (ref 135–145)

## 2016-11-22 LAB — CBC
HEMATOCRIT: 42.3 % (ref 39.0–52.0)
HEMOGLOBIN: 13.9 g/dL (ref 13.0–17.0)
MCH: 30.4 pg (ref 26.0–34.0)
MCHC: 32.9 g/dL (ref 30.0–36.0)
MCV: 92.6 fL (ref 78.0–100.0)
Platelets: 447 10*3/uL — ABNORMAL HIGH (ref 150–400)
RBC: 4.57 MIL/uL (ref 4.22–5.81)
RDW: 13.8 % (ref 11.5–15.5)
WBC: 10.3 10*3/uL (ref 4.0–10.5)

## 2016-11-22 LAB — SURGICAL PCR SCREEN
MRSA, PCR: POSITIVE — AB
STAPHYLOCOCCUS AUREUS: POSITIVE — AB

## 2016-11-22 SURGERY — LUMBAR WOUND DEBRIDEMENT
Anesthesia: General

## 2016-11-22 MED ORDER — LIDOCAINE HCL (CARDIAC) 20 MG/ML IV SOLN
INTRAVENOUS | Status: DC | PRN
  Administered 2016-11-22: 100 mg via INTRAVENOUS

## 2016-11-22 MED ORDER — VANCOMYCIN HCL IN DEXTROSE 1-5 GM/200ML-% IV SOLN
INTRAVENOUS | Status: AC
  Filled 2016-11-22: qty 200

## 2016-11-22 MED ORDER — FENTANYL CITRATE (PF) 100 MCG/2ML IJ SOLN
INTRAMUSCULAR | Status: AC
  Filled 2016-11-22: qty 4

## 2016-11-22 MED ORDER — DEXAMETHASONE SODIUM PHOSPHATE 10 MG/ML IJ SOLN
INTRAMUSCULAR | Status: AC
  Filled 2016-11-22: qty 1

## 2016-11-22 MED ORDER — SUGAMMADEX SODIUM 500 MG/5ML IV SOLN
INTRAVENOUS | Status: AC
  Filled 2016-11-22: qty 5

## 2016-11-22 MED ORDER — MIDAZOLAM HCL 5 MG/5ML IJ SOLN
INTRAMUSCULAR | Status: DC | PRN
  Administered 2016-11-22: 2 mg via INTRAVENOUS

## 2016-11-22 MED ORDER — SUCCINYLCHOLINE CHLORIDE 20 MG/ML IJ SOLN
INTRAMUSCULAR | Status: DC | PRN
  Administered 2016-11-22: 140 mg via INTRAVENOUS

## 2016-11-22 MED ORDER — KETAMINE HCL 10 MG/ML IJ SOLN
INTRAMUSCULAR | Status: DC | PRN
  Administered 2016-11-22: 40 mg via INTRAVENOUS

## 2016-11-22 MED ORDER — EPHEDRINE SULFATE 50 MG/ML IJ SOLN
INTRAMUSCULAR | Status: DC | PRN
  Administered 2016-11-22: 10 mg via INTRAVENOUS

## 2016-11-22 MED ORDER — MUPIROCIN 2 % EX OINT
TOPICAL_OINTMENT | CUTANEOUS | Status: AC
  Administered 2016-11-22: 1 via TOPICAL
  Filled 2016-11-22: qty 22

## 2016-11-22 MED ORDER — LACTATED RINGERS IV SOLN
INTRAVENOUS | Status: DC
  Administered 2016-11-22 (×3): via INTRAVENOUS

## 2016-11-22 MED ORDER — LIDOCAINE-EPINEPHRINE 2 %-1:100000 IJ SOLN
INTRAMUSCULAR | Status: AC
  Filled 2016-11-22: qty 1

## 2016-11-22 MED ORDER — THROMBIN 5000 UNITS EX SOLR
CUTANEOUS | Status: AC
  Filled 2016-11-22: qty 5000

## 2016-11-22 MED ORDER — PROPOFOL 10 MG/ML IV BOLUS
INTRAVENOUS | Status: AC
  Filled 2016-11-22: qty 20

## 2016-11-22 MED ORDER — SODIUM CHLORIDE 0.9 % IR SOLN
Status: DC | PRN
  Administered 2016-11-22: 11:00:00

## 2016-11-22 MED ORDER — MIDAZOLAM HCL 2 MG/2ML IJ SOLN
INTRAMUSCULAR | Status: AC
  Filled 2016-11-22: qty 2

## 2016-11-22 MED ORDER — PROMETHAZINE HCL 25 MG/ML IJ SOLN: 6.2500 mg | INTRAMUSCULAR | Status: DC | PRN

## 2016-11-22 MED ORDER — VANCOMYCIN HCL IN DEXTROSE 1-5 GM/200ML-% IV SOLN
1000.0000 mg | INTRAVENOUS | Status: AC
  Administered 2016-11-22: 1000 mg via INTRAVENOUS

## 2016-11-22 MED ORDER — ASPIRIN 325 MG PO TBEC: 325.0000 mg | DELAYED_RELEASE_TABLET | Freq: Every day | ORAL | Status: DC

## 2016-11-22 MED ORDER — ACETAMINOPHEN 10 MG/ML IV SOLN
INTRAVENOUS | Status: DC | PRN
  Administered 2016-11-22: 1000 mg via INTRAVENOUS

## 2016-11-22 MED ORDER — ONDANSETRON HCL 4 MG/2ML IJ SOLN
INTRAMUSCULAR | Status: AC
  Filled 2016-11-22: qty 2

## 2016-11-22 MED ORDER — SUGAMMADEX SODIUM 500 MG/5ML IV SOLN
INTRAVENOUS | Status: DC | PRN
  Administered 2016-11-22: 400 mg via INTRAVENOUS

## 2016-11-22 MED ORDER — MUPIROCIN 2 % EX OINT
1.0000 "application " | TOPICAL_OINTMENT | Freq: Once | CUTANEOUS | Status: AC
  Administered 2016-11-22: 1 via TOPICAL

## 2016-11-22 MED ORDER — SULFAMETHOXAZOLE-TRIMETHOPRIM 400-80 MG PO TABS: 1.0000 | ORAL_TABLET | Freq: Two times a day (BID) | ORAL | 0 refills | Status: AC

## 2016-11-22 MED ORDER — ONDANSETRON HCL 4 MG/2ML IJ SOLN
INTRAMUSCULAR | Status: DC | PRN
  Administered 2016-11-22: 4 mg via INTRAVENOUS

## 2016-11-22 MED ORDER — THROMBIN 5000 UNITS EX SOLR
CUTANEOUS | Status: DC | PRN
  Administered 2016-11-22: 11:00:00 via TOPICAL

## 2016-11-22 MED ORDER — BUPIVACAINE HCL (PF) 0.5 % IJ SOLN
INTRAMUSCULAR | Status: AC
  Filled 2016-11-22: qty 30

## 2016-11-22 MED ORDER — FENTANYL CITRATE (PF) 100 MCG/2ML IJ SOLN
INTRAMUSCULAR | Status: DC | PRN
  Administered 2016-11-22: 100 ug via INTRAVENOUS

## 2016-11-22 MED ORDER — 0.9 % SODIUM CHLORIDE (POUR BTL) OPTIME
TOPICAL | Status: DC | PRN
  Administered 2016-11-22: 1000 mL

## 2016-11-22 MED ORDER — KETAMINE HCL-SODIUM CHLORIDE 100-0.9 MG/10ML-% IV SOSY
PREFILLED_SYRINGE | INTRAVENOUS | Status: AC
  Filled 2016-11-22: qty 10

## 2016-11-22 MED ORDER — PHENYLEPHRINE HCL 10 MG/ML IJ SOLN
INTRAVENOUS | Status: DC | PRN
  Administered 2016-11-22: 50 ug/min via INTRAVENOUS

## 2016-11-22 MED ORDER — CHLORHEXIDINE GLUCONATE CLOTH 2 % EX PADS: 6.0000 | MEDICATED_PAD | Freq: Once | CUTANEOUS | Status: DC

## 2016-11-22 MED ORDER — PROPOFOL 10 MG/ML IV BOLUS
INTRAVENOUS | Status: DC | PRN
  Administered 2016-11-22: 200 mg via INTRAVENOUS

## 2016-11-22 MED ORDER — PHENYLEPHRINE 40 MCG/ML (10ML) SYRINGE FOR IV PUSH (FOR BLOOD PRESSURE SUPPORT)
PREFILLED_SYRINGE | INTRAVENOUS | Status: AC
  Filled 2016-11-22: qty 10

## 2016-11-22 MED ORDER — EPHEDRINE 5 MG/ML INJ
INTRAVENOUS | Status: AC
  Filled 2016-11-22: qty 10

## 2016-11-22 MED ORDER — ROCURONIUM BROMIDE 100 MG/10ML IV SOLN
INTRAVENOUS | Status: DC | PRN
  Administered 2016-11-22: 40 mg via INTRAVENOUS

## 2016-11-22 MED ORDER — HYDROMORPHONE HCL 1 MG/ML IJ SOLN: 0.2500 mg | INTRAMUSCULAR | Status: DC | PRN

## 2016-11-22 MED ORDER — ACETAMINOPHEN 10 MG/ML IV SOLN
INTRAVENOUS | Status: AC
  Filled 2016-11-22: qty 100

## 2016-11-22 SURGICAL SUPPLY — 38 items
BAG DECANTER FOR FLEXI CONT (MISCELLANEOUS) ×3 IMPLANT
CANISTER SUCT 3000ML PPV (MISCELLANEOUS) ×3 IMPLANT
CARTRIDGE OIL MAESTRO DRILL (MISCELLANEOUS) IMPLANT
DIFFUSER DRILL AIR PNEUMATIC (MISCELLANEOUS) IMPLANT
DRAPE LAPAROTOMY 100X72X124 (DRAPES) ×3 IMPLANT
DRAPE POUCH INSTRU U-SHP 10X18 (DRAPES) ×3 IMPLANT
DRSG OPSITE POSTOP 4X8 (GAUZE/BANDAGES/DRESSINGS) ×3 IMPLANT
ELECT REM PT RETURN 9FT ADLT (ELECTROSURGICAL) ×3
ELECTRODE REM PT RTRN 9FT ADLT (ELECTROSURGICAL) ×1 IMPLANT
GAUZE SPONGE 4X4 12PLY STRL (GAUZE/BANDAGES/DRESSINGS) IMPLANT
GAUZE SPONGE 4X4 16PLY XRAY LF (GAUZE/BANDAGES/DRESSINGS) IMPLANT
GLOVE BIO SURGEON STRL SZ7 (GLOVE) ×3 IMPLANT
GLOVE BIO SURGEON STRL SZ7.5 (GLOVE) ×3 IMPLANT
GLOVE BIOGEL PI IND STRL 7.5 (GLOVE) ×2 IMPLANT
GLOVE BIOGEL PI INDICATOR 7.5 (GLOVE) ×4
GLOVE ECLIPSE 7.0 STRL STRAW (GLOVE) ×6 IMPLANT
GOWN STRL REUS W/ TWL LRG LVL3 (GOWN DISPOSABLE) ×1 IMPLANT
GOWN STRL REUS W/ TWL XL LVL3 (GOWN DISPOSABLE) ×2 IMPLANT
GOWN STRL REUS W/TWL 2XL LVL3 (GOWN DISPOSABLE) ×6 IMPLANT
GOWN STRL REUS W/TWL LRG LVL3 (GOWN DISPOSABLE) ×2
GOWN STRL REUS W/TWL XL LVL3 (GOWN DISPOSABLE) ×4
KIT BASIN OR (CUSTOM PROCEDURE TRAY) ×3 IMPLANT
KIT ROOM TURNOVER OR (KITS) ×3 IMPLANT
NEEDLE HYPO 22GX1.5 SAFETY (NEEDLE) ×3 IMPLANT
NS IRRIG 1000ML POUR BTL (IV SOLUTION) ×3 IMPLANT
OIL CARTRIDGE MAESTRO DRILL (MISCELLANEOUS)
PACK LAMINECTOMY NEURO (CUSTOM PROCEDURE TRAY) ×3 IMPLANT
PAD ARMBOARD 7.5X6 YLW CONV (MISCELLANEOUS) ×9 IMPLANT
SPONGE SURGIFOAM ABS GEL SZ50 (HEMOSTASIS) IMPLANT
STAPLER VISISTAT 35W (STAPLE) ×3 IMPLANT
SUT VIC AB 0 CT1 18XCR BRD8 (SUTURE) ×1 IMPLANT
SUT VIC AB 0 CT1 8-18 (SUTURE) ×2
SUT VICRYL 3-0 RB1 18 ABS (SUTURE) ×3 IMPLANT
SWAB COLLECTION DEVICE MRSA (MISCELLANEOUS) ×3 IMPLANT
SWAB CULTURE ESWAB REG 1ML (MISCELLANEOUS) ×3 IMPLANT
TOWEL GREEN STERILE (TOWEL DISPOSABLE) ×3 IMPLANT
TOWEL GREEN STERILE FF (TOWEL DISPOSABLE) ×2 IMPLANT
WATER STERILE IRR 1000ML POUR (IV SOLUTION) ×3 IMPLANT

## 2016-11-22 NOTE — Discharge Summary (Signed)
Physician Discharge Summary  Patient ID: [name] Hinckley MRN: [id number] DOB/AGE: 61/03/1956 61 y.o.  Admit date: 11/22/2016 Discharge date: 11/22/2016  Admission Diagnoses:  Wound infection  Discharge Diagnoses:  Same Active Problems:   * No active hospital problems. *   Discharged Condition: Stable  Hospital Course:  [name] Zeiser is a 61 y.o. male brought in for wound washout after persistent drainage from previous lumbar fusion. He was at baseline postop and was discharged in stable condition.  Treatments: Surgery - Wound washout  Discharge Exam: Blood pressure (!) 119/57, pulse (!) 106, temperature 98.1 F (36.7 C), temperature source Oral, resp. rate 20, height 6' (1.829 m), weight (!) 166.9 kg (368 lb), SpO2 96 %. Awake, alert, oriented Speech fluent, appropriate CN grossly intact 5/5 BUE/BLE Wound c/Wolfe/i  Disposition: 01-Home or Self Care  Discharge Instructions    Call MD for:  redness, tenderness, or signs of infection (pain, swelling, redness, odor or green/yellow discharge around incision site)    Complete by:  As directed    Call MD for:  temperature >100.4    Complete by:  As directed    Diet - low sodium heart healthy    Complete by:  As directed    Discharge instructions    Complete by:  As directed    Walk at home as much as possible, at least 4 times / day   Increase activity slowly    Complete by:  As directed    Lifting restrictions    Complete by:  As directed    No lifting > 10 lbs   May shower / Bathe    Complete by:  As directed    48 hours after surgery   May walk up steps    Complete by:  As directed    Other Restrictions    Complete by:  As directed    No bending/twisting at waist   Remove dressing in 48 hours    Complete by:  As directed      Allergies as of 11/22/2016      Reactions   Penicillins Swelling   SWELLING OF FACE (when patient was a small child)  Has patient had a PCN reaction causing immediate rash,  facial/tongue/throat swelling, SOB or lightheadedness with hypotension: YES PCN reaction causing severe rash involving mucus membranes or skin necrosis:NO PCN reaction that required hospitalization NO PCN reaction occurring within the last 10 years: NO   Iodinated Diagnostic Agents Rash   Iohexol; during cardiac cath; resolved with Benadryl      Medication List    STOP taking these medications   cephALEXin 500 MG capsule Commonly known as:  KEFLEX   methocarbamol 500 MG tablet Commonly known as:  ROBAXIN   oxyCODONE-acetaminophen 5-325 MG tablet Commonly known as:  PERCOCET/ROXICET     TAKE these medications   aspirin 325 MG EC tablet Take 1 tablet (325 mg total) by mouth daily. Stopped tasking prior to procedure Start taking on:  11/26/2016   DULoxetine 60 MG capsule Commonly known as:  CYMBALTA Take 60 mg by mouth daily.   fluticasone 50 MCG/ACT nasal spray Commonly known as:  FLONASE Place 1 spray into both nostrils daily as needed for allergies.   gabapentin 300 MG capsule Commonly known as:  NEURONTIN Take 900 mg by mouth 3 (three) times daily.   GLUCOSAMINE HCL-MSM PO Take 1 capsule by mouth 2 (two) times daily.   HYDROcodone-acetaminophen 10-325 MG tablet Commonly known as:  NORCO Take 1  tablet by mouth every 4 (four) hours as needed for pain.   lisinopril-hydrochlorothiazide 20-12.5 MG tablet Commonly known as:  PRINZIDE,ZESTORETIC Take 1 tablet by mouth every morning.   meloxicam 15 MG tablet Commonly known as:  MOBIC Take 15 mg by mouth daily.   multivitamin with minerals Tabs tablet Take 1 tablet by mouth daily.   omeprazole 40 MG capsule Commonly known as:  PRILOSEC Take 40 mg by mouth daily.   simvastatin 20 MG tablet Commonly known as:  ZOCOR Take 20 mg by mouth at bedtime.   sulfamethoxazole-trimethoprim 400-80 MG tablet Commonly known as:  BACTRIM Take 1 tablet by mouth 2 (two) times daily.   triamcinolone cream 0.1 % Commonly known  as:  KENALOG Apply 1 application topically daily as needed (Apply to ears for rash/dryness/soreness).      Follow-up Information    Kimbely Whiteaker, C, MD In 2 weeks.   Specialty:  Neurosurgery Why:  For wound re-check Contact information: [address] [place] [address] [phone]           Signed: Lisbeth Renshaw, Salena Saner 11/22/2016, 11:15 AM

## 2016-11-22 NOTE — Anesthesia Procedure Notes (Signed)
Procedure Name: Intubation Date/Time: 11/22/2016 10:19 AM Performed by: Rejeana Brock L Pre-anesthesia Checklist: Patient identified, Emergency Drugs available, Suction available and Patient being monitored Patient Re-evaluated:Patient Re-evaluated prior to inductionOxygen Delivery Method: Circle System Utilized Preoxygenation: Pre-oxygenation with 100% oxygen Intubation Type: IV induction Ventilation: Mask ventilation without difficulty and Oral airway inserted - appropriate to patient size Laryngoscope Size: Mac and 4 Grade View: Grade II Tube type: Oral Number of attempts: 1 Airway Equipment and Method: Stylet and Oral airway Placement Confirmation: ETT inserted through vocal cords under direct vision,  positive ETCO2 and breath sounds checked- equal and bilateral Secured at: 23 cm Tube secured with: Tape Dental Injury: Teeth and Oropharynx as per pre-operative assessment

## 2016-11-22 NOTE — H&P (Signed)
CC:  Drainage from wound  HPI: [name] is a 61 y.o. male who underwent lumbar fusion a few weeks ago. He noted some drainage from the wound and was placed on oral abx. He was seen in f/u in the office yesterday with continued drainage and therefore presents for wound washout. He denied any fevers/chills, or generalized malaise.  PMH: Past Medical History:  Diagnosis Date  . 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 Arthritis   . Constipation due to pain medication   . GERD (gastroesophageal reflux disease)   . H/O staphylococcal infection   . History of hiatal hernia   . Hypertension   . Neuropathy (HCC)   . Sleep apnea    CPAP     PSH: Past Surgical History:  Procedure Laterality Date  . ANTERIOR CRUCIATE LIGAMENT REPAIR     right  . APPLICATION OF ROBOTIC ASSISTANCE FOR SPINAL PROCEDURE N/A 10/28/2016   Procedure: APPLICATION OF ROBOTIC ASSISTANCE FOR SPINAL PROCEDURE;  Surgeon: Lisbeth RenshawNeelesh Keiondra Brookover, MD;  Location: [facility] OR;  Service: Neurosurgery;  Laterality: N/A;  . arthroscopy     right knee  . BACK SURGERY     x 3  . CARDIAC CATHETERIZATION  2005   normal per pt.  . COLONOSCOPY WITH PROPOFOL N/A 02/21/2014   Procedure: COLONOSCOPY WITH PROPOFOL;  Surgeon: Theda BelfastPatrick D Hung, MD;  Location: [facility];  Service: Endoscopy;  Laterality: N/A;  . JOINT REPLACEMENT     right knee  . MENISCUS REPAIR Left    knee    SH: Social History  Substance Use Topics  . Smoking status: Former Smoker    Types: Cigarettes    Quit date: 02/29/2004  . Smokeless tobacco: Never Used  . Alcohol use Yes     Comment: occasionally    MEDS: Prior to Admission medications   Medication Sig Start Date End Date Taking? Authorizing Provider  cephALEXin (KEFLEX) 500 MG capsule Take 500 mg by mouth daily. Take every day to prevent return of staff in infection   Yes Historical Provider, MD  DULoxetine (CYMBALTA) 60 MG capsule Take 60 mg by mouth daily.   Yes Historical Provider, MD   fluticasone (FLONASE) 50 MCG/ACT nasal spray Place 1 spray into both nostrils daily as needed for allergies.    Yes Historical Provider, MD  gabapentin (NEURONTIN) 300 MG capsule Take 900 mg by mouth 3 (three) times daily.    Yes Historical Provider, MD  GLUCOSAMINE HCL-MSM PO Take 1 capsule by mouth 2 (two) times daily.    Yes Historical Provider, MD  HYDROcodone-acetaminophen (NORCO) 10-325 MG tablet Take 1 tablet by mouth every 4 (four) hours as needed for pain. 11/11/16  Yes Historical Provider, MD  lisinopril-hydrochlorothiazide (PRINZIDE,ZESTORETIC) 20-12.5 MG per tablet Take 1 tablet by mouth every morning.   Yes Historical Provider, MD  meloxicam (MOBIC) 15 MG tablet Take 15 mg by mouth daily. 10/12/16  Yes Historical Provider, MD  Multiple Vitamin (MULTIVITAMIN WITH MINERALS) TABS tablet Take 1 tablet by mouth daily.   Yes Historical Provider, MD  omeprazole (PRILOSEC) 40 MG capsule Take 40 mg by mouth daily.    Yes Historical Provider, MD  simvastatin (ZOCOR) 20 MG tablet Take 20 mg by mouth at bedtime.   Yes Historical Provider, MD  aspirin 325 MG EC tablet Take 325 mg by mouth daily. Stopped tasking prior to procedure    Historical Provider, MD  methocarbamol (ROBAXIN) 500 MG tablet Take 1 tablet (500 mg total) by  mouth every 6 (six) hours as needed for muscle spasms. Patient not taking: Reported on 11/21/2016 10/30/16   Darci Current Costella, PA-C  oxyCODONE-acetaminophen (PERCOCET/ROXICET) 5-325 MG tablet Take 1-2 tablets by mouth every 4 (four) hours as needed for moderate pain. Patient not taking: Reported on 11/21/2016 10/30/16   Darci Current Costella, PA-C  triamcinolone cream (KENALOG) 0.1 % Apply 1 application topically daily as needed (Apply to ears for rash/dryness/soreness).  11/12/15   Historical Provider, MD    ALLERGY: Allergies  Allergen Reactions  . Penicillins Swelling    SWELLING OF FACE (when patient was a small child)  Has patient had a PCN reaction causing immediate rash,  facial/tongue/throat swelling, SOB or lightheadedness with hypotension: YES PCN reaction causing severe rash involving mucus membranes or skin necrosis:NO PCN reaction that required hospitalization NO PCN reaction occurring within the last 10 years: NO   . Iodinated Diagnostic Agents Rash    Iohexol; during cardiac cath; resolved with Benadryl    ROS: ROS  NEUROLOGIC EXAM: Awake, alert, oriented Memory and concentration grossly intact Speech fluent, appropriate CN grossly intact Motor exam: Upper Extremities Deltoid Bicep Tricep Grip  Right 5/5 5/5 5/5 5/5  Left 5/5 5/5 5/5 5/5   Lower Extremity IP Quad PF DF EHL  Right 5/5 5/5 5/5 5/5 5/5  Left 5/5 5/5 5/5 5/5 5/5   Sensation grossly intact to LT   IMPRESSION: - 61 y.o. male s/p lumbar fusion with likely superficial wound infection  PLAN: - Proceed with wound washout  I have reviewed the risks/benefits/alternatives to surgery with the patient. All questions were answered and consent was obtained.

## 2016-11-22 NOTE — Transfer of Care (Signed)
Immediate Anesthesia Transfer of Care Note  Patient: [name]  Procedure(s) Performed: Procedure(s) with comments: LUMBAR WOUND WASH OUT (N/A) - LUMBAR WOUND WASH OUT  Patient Location: PACU  Anesthesia Type:General  Level of Consciousness: awake and alert   Airway & Oxygen Therapy: Patient Spontanous Breathing and Patient connected to face mask oxygen  Post-op Assessment: Report given to RN and Post -op Vital signs reviewed and stable  Post vital signs: Reviewed and stable  Last Vitals:  Vitals:   11/22/16 0754  BP: (!) 119/57  Pulse: (!) 106  Resp: 20  Temp: 36.7 C    Last Pain:  Vitals:   11/22/16 0807  TempSrc:   PainSc: 6       Patients Stated Pain Goal: 3 (11/22/16 0807)  Complications: No apparent anesthesia complications

## 2016-11-22 NOTE — Anesthesia Postprocedure Evaluation (Addendum)
Anesthesia Post Note  Patient: [name]  Procedure(s) Performed: Procedure(s) (LRB): LUMBAR WOUND WASH OUT (N/A)  Patient location during evaluation: PACU Anesthesia Type: General Level of consciousness: sedated Pain management: pain level controlled Vital Signs Assessment: post-procedure vital signs reviewed and stable Respiratory status: spontaneous breathing and respiratory function stable Cardiovascular status: stable Anesthetic complications: no       Last Vitals:  Vitals:   11/22/16 0754 11/22/16 1130  BP: (!) 119/57   Pulse: (!) 106   Resp: 20   Temp: 36.7 C 36.2 C    Last Pain:  Vitals:   11/22/16 1200  TempSrc:   PainSc: 4                  Gennie Eisinger DANIEL

## 2016-11-22 NOTE — Op Note (Signed)
PREOP DIAGNOSIS:  1. Possible wound infection   POSTOP DIAGNOSIS:  Same  PROCEDURE: 1. Sharp excisional wound debridement, washout  SURGEON: Dr. Lisbeth RenshawNeelesh Roosevelt Bisher, MD  ASSISTANT: Cindra PresumeVincent Costella, PA-C  ANESTHESIA: General Endotracheal  EBL: Minimal  SPECIMENS: Wound culture swabs  DRAINS: none  COMPLICATIONS: none immediate  CONDITION: stable to PACU  HISTORY: [name] is a 61 y.o. male who underwent lumbar fusion a few weeks ago and was seen in the office with persistent wound drainage. He therefore elected to proceed with wound washout/debridement.  PROCEDURE IN DETAIL: After informed consent was obtained and witnessed, the patient was brought to the operating room. After induction of general anesthesia, the patient was positioned on the operative table in the prone position. All pressure points were meticulously padded. Previous skin incision was then marked out and prepped and draped in the usual sterile fashion.  After timeout was conducted, the inferior margin of the wound was opened sharply. Thin fluid was encountered which was cultured. There was no frankly purulent fluid. I suspect the drainage was serous fluid, possibly with fat necrosis. Wound edges appeared to be granulating well. The fascia was probed and appeared to be intact, without any dehiscent areas.   At this point the wound is irrigated with copious amounts of antibiotic saline irrigation. Wound was then closed in multiple layers with staples for the skin. Bacitracin ointment and sterile dressing was applied. Patient was then transferred to the stretcher, extubated, and taken to PACU in stable condition.  At the end of the case all sponge, needle, and instrument counts were correct.

## 2016-11-22 NOTE — Anesthesia Preprocedure Evaluation (Addendum)
Anesthesia Evaluation  Patient identified by MRN, date of birth, ID band Patient awake    Reviewed: Allergy & Precautions, H&P , NPO status , Patient's Chart, lab work & pertinent test results  History of Anesthesia Complications Negative for: history of anesthetic complications  Airway Mallampati: III  TM Distance: >3 FB Neck ROM: Full    Dental  (+) Dental Advisory Given, Poor Dentition   Pulmonary sleep apnea and Continuous Positive Airway Pressure Ventilation , former smoker,    Pulmonary exam normal breath sounds clear to auscultation       Cardiovascular hypertension, Pt. on medications  Rhythm:Regular Rate:Normal     Neuro/Psych negative neurological ROS  negative psych ROS   GI/Hepatic Neg liver ROS, hiatal hernia, GERD  Medicated and Controlled,  Endo/Other  Morbid obesity  Renal/GU negative Renal ROS  negative genitourinary   Musculoskeletal  (+) Arthritis , Osteoarthritis,    Abdominal   Peds  Hematology negative hematology ROS (+) anemia ,   Anesthesia Other Findings   Reproductive/Obstetrics negative OB ROS                            Anesthesia Physical  Anesthesia Plan  ASA: III  Anesthesia Plan: General   Post-op Pain Management:    Induction: Intravenous and Rapid sequence  Airway Management Planned: Oral ETT  Additional Equipment:   Intra-op Plan:   Post-operative Plan: Extubation in OR  Informed Consent: I have reviewed the patients History and Physical, chart, labs and discussed the procedure including the risks, benefits and alternatives for the proposed anesthesia with the patient or authorized representative who has indicated his/her understanding and acceptance.   Dental advisory given  Plan Discussed with: CRNA and Anesthesiologist  Anesthesia Plan Comments:        Anesthesia Quick Evaluation

## 2016-11-23 ENCOUNTER — Encounter (HOSPITAL_COMMUNITY): Payer: Self-pay | Admitting: Neurosurgery

## 2016-11-29 LAB — AEROBIC/ANAEROBIC CULTURE (SURGICAL/DEEP WOUND)

## 2016-11-29 LAB — AEROBIC/ANAEROBIC CULTURE W GRAM STAIN (SURGICAL/DEEP WOUND)

## 2017-02-18 NOTE — Addendum Note (Signed)
Addendum  created 02/18/17 1057 by Joleene Burnham, MD   Sign clinical note    

## 2017-02-20 NOTE — Addendum Note (Signed)
Addendum  created 02/20/17 1100 by Naseem Varden, MD   Sign clinical note    

## 2017-08-25 ENCOUNTER — Other Ambulatory Visit: Payer: Self-pay | Admitting: Family Medicine

## 2017-08-25 DIAGNOSIS — N63 Unspecified lump in unspecified breast: Secondary | ICD-10-CM

## 2017-08-29 ENCOUNTER — Ambulatory Visit: Admission: RE | Admit: 2017-08-29 | Payer: Medicare Other | Source: Ambulatory Visit

## 2017-08-29 ENCOUNTER — Ambulatory Visit
Admission: RE | Admit: 2017-08-29 | Discharge: 2017-08-29 | Disposition: A | Discharge status: Home / Self Care | Payer: Medicare Other | Source: Ambulatory Visit | Attending: Family Medicine | Admitting: Family Medicine

## 2017-08-29 ENCOUNTER — Other Ambulatory Visit: Payer: Self-pay | Admitting: Family Medicine

## 2017-08-29 DIAGNOSIS — N63 Unspecified lump in unspecified breast: Secondary | ICD-10-CM

## 2018-01-18 ENCOUNTER — Other Ambulatory Visit: Payer: Self-pay

## 2018-01-18 ENCOUNTER — Observation Stay (HOSPITAL_COMMUNITY)
Admission: EM | Admit: 2018-01-18 | Discharge: 2018-01-20 | Disposition: A | Discharge status: Home / Self Care | Payer: Medicare Other | Attending: Internal Medicine | Admitting: Internal Medicine

## 2018-01-18 ENCOUNTER — Encounter (HOSPITAL_COMMUNITY): Payer: Self-pay | Admitting: Emergency Medicine

## 2018-01-18 DIAGNOSIS — M1990 Unspecified osteoarthritis, unspecified site: Secondary | ICD-10-CM | POA: Insufficient documentation

## 2018-01-18 DIAGNOSIS — R262 Difficulty in walking, not elsewhere classified: Secondary | ICD-10-CM | POA: Insufficient documentation

## 2018-01-18 DIAGNOSIS — T40601A Poisoning by unspecified narcotics, accidental (unintentional), initial encounter: Secondary | ICD-10-CM | POA: Diagnosis present

## 2018-01-18 DIAGNOSIS — K219 Gastro-esophageal reflux disease without esophagitis: Secondary | ICD-10-CM | POA: Insufficient documentation

## 2018-01-18 DIAGNOSIS — K08409 Partial loss of teeth, unspecified cause, unspecified class: Secondary | ICD-10-CM | POA: Insufficient documentation

## 2018-01-18 DIAGNOSIS — E860 Dehydration: Principal | ICD-10-CM | POA: Insufficient documentation

## 2018-01-18 DIAGNOSIS — Z820 Family history of epilepsy and other diseases of the nervous system: Secondary | ICD-10-CM | POA: Insufficient documentation

## 2018-01-18 DIAGNOSIS — G629 Polyneuropathy, unspecified: Secondary | ICD-10-CM | POA: Diagnosis not present

## 2018-01-18 DIAGNOSIS — R000 Tachycardia, unspecified: Secondary | ICD-10-CM | POA: Diagnosis present

## 2018-01-18 DIAGNOSIS — Z96651 Presence of right artificial knee joint: Secondary | ICD-10-CM | POA: Insufficient documentation

## 2018-01-18 DIAGNOSIS — R531 Weakness: Secondary | ICD-10-CM | POA: Insufficient documentation

## 2018-01-18 DIAGNOSIS — E876 Hypokalemia: Secondary | ICD-10-CM | POA: Insufficient documentation

## 2018-01-18 DIAGNOSIS — G4730 Sleep apnea, unspecified: Secondary | ICD-10-CM | POA: Diagnosis not present

## 2018-01-18 DIAGNOSIS — D509 Iron deficiency anemia, unspecified: Secondary | ICD-10-CM | POA: Diagnosis not present

## 2018-01-18 DIAGNOSIS — Z8619 Personal history of other infectious and parasitic diseases: Secondary | ICD-10-CM | POA: Insufficient documentation

## 2018-01-18 DIAGNOSIS — Z7982 Long term (current) use of aspirin: Secondary | ICD-10-CM | POA: Diagnosis not present

## 2018-01-18 DIAGNOSIS — F329 Major depressive disorder, single episode, unspecified: Secondary | ICD-10-CM | POA: Insufficient documentation

## 2018-01-18 DIAGNOSIS — E785 Hyperlipidemia, unspecified: Secondary | ICD-10-CM | POA: Diagnosis not present

## 2018-01-18 DIAGNOSIS — Z87891 Personal history of nicotine dependence: Secondary | ICD-10-CM | POA: Insufficient documentation

## 2018-01-18 DIAGNOSIS — Z79899 Other long term (current) drug therapy: Secondary | ICD-10-CM | POA: Diagnosis not present

## 2018-01-18 DIAGNOSIS — D649 Anemia, unspecified: Secondary | ICD-10-CM | POA: Insufficient documentation

## 2018-01-18 DIAGNOSIS — I10 Essential (primary) hypertension: Secondary | ICD-10-CM | POA: Insufficient documentation

## 2018-01-18 DIAGNOSIS — K449 Diaphragmatic hernia without obstruction or gangrene: Secondary | ICD-10-CM | POA: Diagnosis not present

## 2018-01-18 DIAGNOSIS — Z8249 Family history of ischemic heart disease and other diseases of the circulatory system: Secondary | ICD-10-CM | POA: Diagnosis not present

## 2018-01-18 DIAGNOSIS — Z880 Allergy status to penicillin: Secondary | ICD-10-CM | POA: Diagnosis not present

## 2018-01-18 DIAGNOSIS — N400 Enlarged prostate without lower urinary tract symptoms: Secondary | ICD-10-CM | POA: Insufficient documentation

## 2018-01-18 DIAGNOSIS — Z803 Family history of malignant neoplasm of breast: Secondary | ICD-10-CM | POA: Insufficient documentation

## 2018-01-18 DIAGNOSIS — Z9889 Other specified postprocedural states: Secondary | ICD-10-CM | POA: Insufficient documentation

## 2018-01-18 LAB — CBC WITH DIFFERENTIAL/PLATELET
BASOS ABS: 0 10*3/uL (ref 0.0–0.1)
Basophils Relative: 0 %
Eosinophils Absolute: 0 10*3/uL (ref 0.0–0.7)
Eosinophils Relative: 0 %
HEMATOCRIT: 42.8 % (ref 39.0–52.0)
HEMOGLOBIN: 14.9 g/dL (ref 13.0–17.0)
LYMPHS PCT: 19 %
Lymphs Abs: 2.5 10*3/uL (ref 0.7–4.0)
MCH: 32.3 pg (ref 26.0–34.0)
MCHC: 34.8 g/dL (ref 30.0–36.0)
MCV: 92.6 fL (ref 78.0–100.0)
Monocytes Absolute: 0.8 10*3/uL (ref 0.1–1.0)
Monocytes Relative: 6 %
NEUTROS ABS: 9.9 10*3/uL — AB (ref 1.7–7.7)
NEUTROS PCT: 75 %
PLATELETS: 271 10*3/uL (ref 150–400)
RBC: 4.62 MIL/uL (ref 4.22–5.81)
RDW: 13.2 % (ref 11.5–15.5)
WBC: 13.1 10*3/uL — AB (ref 4.0–10.5)

## 2018-01-18 LAB — BASIC METABOLIC PANEL
ANION GAP: 13 (ref 5–15)
BUN: 44 mg/dL — AB (ref 6–20)
CALCIUM: 9.6 mg/dL (ref 8.9–10.3)
CO2: 22 mmol/L (ref 22–32)
CREATININE: 1.07 mg/dL (ref 0.61–1.24)
Chloride: 103 mmol/L (ref 101–111)
GFR calc Af Amer: >60 mL/min (ref >60)
GFR calc non Af Amer: >60 mL/min (ref >60)
Glucose, Bld: 125 mg/dL — ABNORMAL HIGH (ref 65–99)
POTASSIUM: 4.4 mmol/L (ref 3.5–5.1)
Sodium: 138 mmol/L (ref 135–145)

## 2018-01-18 LAB — TYPE AND SCREEN
ABO/RH(D): A POS
ANTIBODY SCREEN: NEGATIVE

## 2018-01-18 MED ORDER — SODIUM CHLORIDE 0.9 % IV BOLUS
1000.0000 mL | Freq: Once | INTRAVENOUS | Status: AC
  Administered 2018-01-18: 1000 mL via INTRAVENOUS

## 2018-01-18 NOTE — ED Notes (Signed)
Iv team at bedside  

## 2018-01-18 NOTE — ED Notes (Signed)
Per EDP, pt may take home medication for pain

## 2018-01-18 NOTE — ED Notes (Signed)
Attempted IV x 1 without success.  

## 2018-01-18 NOTE — ED Notes (Signed)
Pt squirming in bed and asking son for more pain medication. Pt has slurred speech. Pt reports taking 2 hydrocodone pills

## 2018-01-18 NOTE — ED Provider Notes (Addendum)
[facility]-EMERGENCY DEPT Provider Note  CSN: [id number] Arrival date & time: 01/18/18 1947  Chief Complaint(s) Loss of Consciousness and Hypotension  HPI [name] is a 62 y.o. male with a history of iron deficiency anemia who recently had all his upper teeth pulled by an orthodontist yesterday presents to the emergency department for near syncopal episode that occurred just prior to arrival.  Patient reported that after the surgery the bleeding stopped however a few hours after getting home it started oozing and bleeding again.  He spoke with the orthodontist who recommended putting teabags which did not stop the bleeding.  Patient reported feeling increasingly fatigued and orthostatic.  States that he was on the toilet and noticed a melenic stool today.  He felt extremely fatigued and was unable to get off the toilet.  He states that he threw himself onto the floor but did not pass out.  Patient denied any associated chest pain or shortness of breath.  No nausea or vomiting.  No abdominal pain.  States that he has been swallowing blood all night long.  He states that his son heard him and called EMS.  When EMS arrived they noted that the patient was hypotensive.  They were unable to gain IV access thus the patient has not received any IV fluids.  HPI   Past Medical History Past Medical History:  Diagnosis Date  . 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 Arthritis   . Constipation due to pain medication   . GERD (gastroesophageal reflux disease)   . H/O staphylococcal infection   . History of hiatal hernia   . Hypertension   . Neuropathy   . Sleep apnea    CPAP    Patient Active Problem List   Diagnosis Date Noted  . 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 Spondylolisthesis at L5-S1 level 04/12/2016  . NAUSEA 07/21/2009  . ANEMIA OF OTHER CHRONIC DISEASE 05/11/2009  . PROSTHETIC JOINT COMPLICATION 05/11/2009   Home  Medication(s) Prior to Admission medications   Medication Sig Start Date End Date Taking? Authorizing Provider  aspirin 325 MG EC tablet Take 1 tablet (325 mg total) by mouth daily. Stopped tasking prior to procedure 11/26/16  Yes Lisbeth Renshaw, MD  cephALEXin (KEFLEX) 500 MG capsule Take 500 mg by mouth daily.   Yes [provider]  gabapentin (NEURONTIN) 300 MG capsule Take 900 mg by mouth 3 (three) times daily.    Yes [provider]  GLUCOSAMINE HCL-MSM PO Take 1 capsule by mouth 2 (two) times daily.    Yes [provider]  HYDROcodone-acetaminophen (NORCO) 10-325 MG tablet Take 1 tablet by mouth every 4 (four) hours as needed for pain. 11/11/16  Yes [provider]  lisinopril-hydrochlorothiazide (PRINZIDE,ZESTORETIC) 20-12.5 MG per tablet Take 1 tablet by mouth every morning.   Yes [provider]  meloxicam (MOBIC) 15 MG tablet Take 15 mg by mouth daily. 10/12/16  Yes [provider]  Multiple Vitamin (MULTIVITAMIN WITH MINERALS) TABS tablet Take 1 tablet by mouth daily.   Yes [provider]  omeprazole (PRILOSEC) 40 MG capsule Take 40 mg by mouth daily.    Yes [provider]  simvastatin (ZOCOR) 20 MG tablet Take 20 mg by mouth at bedtime.   Yes [provider]  tamsulosin (FLOMAX) 0.4 MG CAPS capsule Take 0.4 mg by mouth daily.   Yes [provider]  traZODone (DESYREL) 100 MG tablet Take 100 mg  by mouth at bedtime.   Yes [provider]                                                                                                                                    Past Surgical History Past Surgical History:  Procedure Laterality Date  . ANTERIOR CRUCIATE LIGAMENT REPAIR     right  . APPLICATION OF ROBOTIC ASSISTANCE FOR SPINAL PROCEDURE N/A 10/28/2016   Procedure: APPLICATION OF ROBOTIC ASSISTANCE FOR SPINAL PROCEDURE;  Surgeon: Lisbeth Renshaw, MD;  Location: [facility] OR;  Service:  Neurosurgery;  Laterality: N/A;  . arthroscopy     right knee  . BACK SURGERY     x 3  . CARDIAC CATHETERIZATION  2005   normal per pt.  . COLONOSCOPY WITH PROPOFOL N/A 02/21/2014   Procedure: COLONOSCOPY WITH PROPOFOL;  Surgeon: Theda Belfast, MD;  Location: [facility] ENDOSCOPY;  Service: Endoscopy;  Laterality: N/A;  . JOINT REPLACEMENT     right knee  . LUMBAR WOUND DEBRIDEMENT N/A 11/22/2016   Procedure: LUMBAR WOUND WASH OUT;  Surgeon: Lisbeth Renshaw, MD;  Location: [facility] OR;  Service: Neurosurgery;  Laterality: N/A;  LUMBAR WOUND WASH OUT  . MENISCUS REPAIR Left    knee   Family History Family History  Problem Relation Age of Onset  . Breast cancer Mother   . Hypertension Mother   . Alzheimer's disease Father   . Breast cancer Sister   . Breast cancer Sister     Social History Social History   Tobacco Use  . Smoking status: Former Smoker    Types: Cigarettes    Last attempt to quit: 02/29/2004    Years since quitting: 13.8  . Smokeless tobacco: Never Used  Substance Use Topics  . Alcohol use: Not Currently    Comment: occasionally  . Drug use: No   Allergies Penicillins and Iodinated diagnostic agents  Review of Systems Review of Systems All other systems are reviewed and are negative for acute change except as noted in the HPI  Physical Exam Vital Signs  I have reviewed the triage vital signs BP (!) 142/83 (BP Location: Left Arm)   Pulse (!) 125   Temp 99 F (37.2 C) (Oral)   Resp 20   SpO2 97%   Physical Exam  Constitutional: He is oriented to person, place, and time. He appears well-developed and well-nourished. No distress.  HENT:  Head: Normocephalic and atraumatic.  Nose: Nose normal.  Edentulous to the maxilla, with intact sutures.  No active bleeding.  No swelling or erythema.  Eyes: Pupils are equal, round, and reactive to light. Conjunctivae and EOM are normal. Right eye exhibits no discharge. Left eye exhibits no discharge. No scleral icterus.    Neck: Normal range of motion. Neck supple.  Cardiovascular: Regular rhythm. Tachycardia present. Exam reveals no gallop and no friction rub.  No murmur heard. Pulmonary/Chest: Effort normal and breath sounds normal.  No stridor. No respiratory distress. He has no rales.  Abdominal: Soft. He exhibits no distension. There is no tenderness.  Musculoskeletal: He exhibits no edema or tenderness.  Neurological: He is alert and oriented to person, place, and time.  Skin: Skin is warm and dry. No rash noted. He is not diaphoretic. No erythema.  Psychiatric: He has a normal mood and affect.  Vitals reviewed.   ED Results and Treatments Labs (all labs ordered are listed, but only abnormal results are displayed) Labs Reviewed  CBC WITH DIFFERENTIAL/PLATELET - Abnormal; Notable for the following components:      Result Value   WBC 13.1 (*)    Neutro Abs 9.9 (*)    All other components within normal limits  BASIC METABOLIC PANEL - Abnormal; Notable for the following components:   Glucose, Bld 125 (*)    BUN 44 (*)    All other components within normal limits  TYPE AND SCREEN                                                                                                                         EKG  EKG Interpretation  Date/Time:  Thursday Jan 18 2018 19:56:26 EDT Ventricular Rate:  123 PR Interval:    QRS Duration: 93 QT Interval:  333 QTC Calculation: 477 R Axis:   66 Text Interpretation:  Sinus tachycardia Ventricular premature complex Aberrant complex Probable left atrial enlargement Abnormal R-wave progression, early transition Borderline prolonged QT interval NO STEMI Confirmed by Drema Pry ([id number]) on 01/19/2018 12:09:07 AM      Radiology No results found. Pertinent labs & imaging results that were available during my care of the patient were reviewed by me and considered in my medical decision making (see chart for details).  Medications Ordered in ED Medications  naloxone  (NARCAN) injection 0.2 mg (has no administration in time range)  sodium chloride 0.9 % bolus 1,000 mL (0 mLs Intravenous Stopped 01/18/18 2331)                                                                                                                                    Procedures Procedures CRITICAL CARE Performed by: Amadeo Garnet Cardama Total critical care time: 40 minutes Critical care time was exclusive of separately billable procedures and treating other patients. Critical care was necessary to treat or prevent imminent or life-threatening deterioration. Critical care was time  spent personally by me on the following activities: development of treatment plan with patient and/or surrogate as well as nursing, discussions with consultants, evaluation of patient's response to treatment, examination of patient, obtaining history from patient or surrogate, ordering and performing treatments and interventions, ordering and review of laboratory studies, ordering and review of radiographic studies, pulse oximetry and re-evaluation of patient's condition.   (including critical care time)  Medical Decision Making / ED Course I have reviewed the nursing notes for this encounter and the patient's prior records (if available in EHR or on provided paperwork).    Presentation concerning for possible symptomatic anemia from blood loss.  Upon arrival patient was hemodynamically stable without intervention.  He is tachycardic.  Provided with IV fluids.  Labs are grossly reassuring with stable hemoglobin.  No evidence of renal insufficiency or significant electrolyte derangements.  Pt took his home dose of medication in the ED and apparently took twice the recommended dosing of his 10/325 Norco. Pt became lethargic and slurred his speech. He was given small dose of narcan.  It appears that the patient maybe taking extra pain medication and is symptomatic from this.   Recommended that he be careful with  narcotic pain medication as this can lead to significant/severe complications.  Will monitor, but plan for DC after monitoring if stable.  Patient care turned over to Dr Effie Shy at [facility]. Patient case and results discussed in detail; please see their note for further ED managment.     Final Clinical Impression(s) / ED Diagnoses Final diagnoses:  Narcotic overdose, accidental or unintentional, initial encounter (HCC)  Tachycardia      This chart was dictated using voice recognition software.  Despite best efforts to proofread,  errors can occur which can change the documentation meaning.   Nira Conn, MD 01/19/18 0031    Nira Conn, MD 01/29/18 2306

## 2018-01-18 NOTE — ED Notes (Signed)
Attempt to start IV x 1 with no success. RN Misty Polinski attempting now

## 2018-01-18 NOTE — ED Triage Notes (Signed)
Per EMS pt had oral surgery yesterday   Today pt has been dizzy and swallowing a lot of blood from his surgery  Pt has had vomiting today and this evening he had an episode of diarrhea and when he went to get off the toilet he had a syncopal episode  Diarrhea was dark in color like coffee  Pt denies neck or back pain from the syncopal episode  Initial blood pressure was 74 systolic and pt did not have any radial or pedal pulses  Pt was placed in trendelenburg  EMS attempted IV but unable to obtain  Pt does take aspirin  daily but has not had any since yesterday

## 2018-01-19 DIAGNOSIS — E785 Hyperlipidemia, unspecified: Secondary | ICD-10-CM | POA: Diagnosis not present

## 2018-01-19 DIAGNOSIS — I10 Essential (primary) hypertension: Secondary | ICD-10-CM | POA: Diagnosis not present

## 2018-01-19 DIAGNOSIS — E860 Dehydration: Secondary | ICD-10-CM | POA: Diagnosis present

## 2018-01-19 MED ORDER — ADULT MULTIVITAMIN W/MINERALS CH
1.0000 | ORAL_TABLET | Freq: Every day | ORAL | Status: DC
  Administered 2018-01-19 – 2018-01-20 (×2): 1 via ORAL
  Filled 2018-01-19 (×3): qty 1

## 2018-01-19 MED ORDER — ACETAMINOPHEN 325 MG PO TABS: 650.0000 mg | ORAL_TABLET | Freq: Four times a day (QID) | ORAL | Status: DC | PRN

## 2018-01-19 MED ORDER — PANTOPRAZOLE SODIUM 40 MG PO TBEC
40.0000 mg | DELAYED_RELEASE_TABLET | Freq: Every day | ORAL | Status: DC
  Administered 2018-01-19 – 2018-01-20 (×2): 40 mg via ORAL
  Filled 2018-01-19 (×3): qty 1

## 2018-01-19 MED ORDER — SODIUM CHLORIDE 0.9% FLUSH: 10.0000 mL | INTRAVENOUS | Status: DC | PRN

## 2018-01-19 MED ORDER — SIMVASTATIN 20 MG PO TABS: 20.0000 mg | ORAL_TABLET | Freq: Every day | ORAL | Status: DC

## 2018-01-19 MED ORDER — ONDANSETRON HCL 4 MG/2ML IJ SOLN
4.0000 mg | Freq: Four times a day (QID) | INTRAMUSCULAR | Status: DC | PRN
  Administered 2018-01-19: 4 mg via INTRAVENOUS
  Filled 2018-01-19: qty 2

## 2018-01-19 MED ORDER — DEXTROSE IN LACTATED RINGERS 5 % IV SOLN
INTRAVENOUS | Status: DC
  Administered 2018-01-19 – 2018-01-20 (×2): via INTRAVENOUS

## 2018-01-19 MED ORDER — SODIUM CHLORIDE 0.9% FLUSH: 10.0000 mL | Freq: Two times a day (BID) | INTRAVENOUS | Status: DC

## 2018-01-19 MED ORDER — TAMSULOSIN HCL 0.4 MG PO CAPS
0.4000 mg | ORAL_CAPSULE | Freq: Every day | ORAL | Status: DC
  Administered 2018-01-19 – 2018-01-20 (×2): 0.4 mg via ORAL
  Filled 2018-01-19 (×3): qty 1

## 2018-01-19 MED ORDER — FAMOTIDINE IN NACL 20-0.9 MG/50ML-% IV SOLN
20.0000 mg | Freq: Two times a day (BID) | INTRAVENOUS | Status: DC
  Administered 2018-01-19 – 2018-01-20 (×3): 20 mg via INTRAVENOUS
  Filled 2018-01-19 (×3): qty 50

## 2018-01-19 MED ORDER — NALOXONE HCL 0.4 MG/ML IJ SOLN
0.2000 mg | Freq: Once | INTRAMUSCULAR | Status: AC
  Administered 2018-01-19: 0.2 mg via INTRAVENOUS
  Filled 2018-01-19: qty 1

## 2018-01-19 MED ORDER — ACETAMINOPHEN 650 MG RE SUPP: 650.0000 mg | Freq: Four times a day (QID) | RECTAL | Status: DC | PRN

## 2018-01-19 MED ORDER — HYDROCODONE-ACETAMINOPHEN 10-325 MG PO TABS
1.0000 | ORAL_TABLET | ORAL | Status: DC | PRN
  Administered 2018-01-19 (×3): 1 via ORAL
  Filled 2018-01-19 (×3): qty 1

## 2018-01-19 MED ORDER — SIMVASTATIN 10 MG PO TABS
20.0000 mg | ORAL_TABLET | Freq: Every day | ORAL | Status: DC
  Administered 2018-01-19: 20 mg via ORAL
  Filled 2018-01-19: qty 2

## 2018-01-19 MED ORDER — MORPHINE SULFATE (PF) 2 MG/ML IV SOLN: 1.0000 mg | INTRAVENOUS | Status: DC | PRN

## 2018-01-19 MED ORDER — MORPHINE SULFATE (PF) 4 MG/ML IV SOLN
1.0000 mg | INTRAVENOUS | Status: DC | PRN
  Administered 2018-01-19 – 2018-01-20 (×3): 1 mg via INTRAVENOUS
  Filled 2018-01-19 (×3): qty 1

## 2018-01-19 MED ORDER — MELOXICAM 15 MG PO TABS
15.0000 mg | ORAL_TABLET | Freq: Every day | ORAL | Status: DC
  Administered 2018-01-19 – 2018-01-20 (×2): 15 mg via ORAL
  Filled 2018-01-19 (×2): qty 1

## 2018-01-19 MED ORDER — CEPHALEXIN 500 MG PO CAPS
500.0000 mg | ORAL_CAPSULE | Freq: Every day | ORAL | Status: DC
  Administered 2018-01-19 – 2018-01-20 (×2): 500 mg via ORAL
  Filled 2018-01-19 (×3): qty 1

## 2018-01-19 MED ORDER — SODIUM CHLORIDE 0.9 % IV BOLUS
1000.0000 mL | Freq: Once | INTRAVENOUS | Status: AC
  Administered 2018-01-19: 1000 mL via INTRAVENOUS

## 2018-01-19 MED ORDER — ONDANSETRON HCL 4 MG PO TABS: 4.0000 mg | ORAL_TABLET | Freq: Four times a day (QID) | ORAL | Status: DC | PRN

## 2018-01-19 MED ORDER — TRAZODONE HCL 100 MG PO TABS
100.0000 mg | ORAL_TABLET | Freq: Every day | ORAL | Status: DC
  Administered 2018-01-19: 100 mg via ORAL
  Filled 2018-01-19: qty 1

## 2018-01-19 NOTE — ED Provider Notes (Addendum)
3:02 AM.  Patient unable to stand and walk or stand for orthostatics.  He remains confused.  Vital signs at 0039 with elevated heart rate, 115.  Mild elevation blood pressure 141/77.  We will give fluid bolus and continue to observe.  6:05 AM: He is now sitting up, alert, conversant, with mild dysarthria.  He states that he wants to go home now and states that his son who is at home will come and get him.  Currently no active oral bleeding.  Vital signs at 5:15 AM, pulse 109, respiratory rate 21 and oxygen saturation 100%.  He is status post 2 L IV saline infusions.  Findings discussed with the patient and all questions were answered.   Mancel Bale, MD 01/19/18 [phone]   7:05 AM-patient son arrived to take him home and the patient could not stand or walk.  Patient is now more verbal, and cooperative and states he would like to try eating and drinking some.  The patient's son is here now and feels like the patient might be taking double dose of his medication.  He is prescribed oxycodone 10 mg, to take 1 every 4 hours as needed.  Patient has chronic pain and has been on high-dose narcotic pain medicine for a long time.   Mancel Bale, MD 01/19/18 [phone]

## 2018-01-19 NOTE — ED Notes (Signed)
IV team at bedside 

## 2018-01-19 NOTE — H&P (Addendum)
History and Physical    [name] ZOX:[id number] DOB: 1956/03/29 DOA: 01/18/2018  PCP: Barbie Banner, MD   Patient coming from: Home   Chief Complaint: Severe and progressive weakness.   HPI: [name] is a 62 y.o. male with medical history significant of periodontal disease, with recent all upper teeth extraction  Jan 17, 2018. Patient did not stop taking aspiring preoperatively.  He has been experiencing severe oral pain, and persistent bleeding after the oral surgical intervention.  The pain has been persistent over the last 48 hours, moderate to severe in intensity, no improving factors, no worsening factors, associated with significant decreased oral intake and generalized weakness, no nausea or vomiting.  Last night he had a collapsing episode with no loss of consciousness, then he was brought to the hospital for further evaluation.  It is unclear if patient was taken more narcotics than originally prescribed.   ED Course: Patient was evaluated, he was found dehydrated, persistently symptomatic in terms of weakness and difficulty ambulating, he was referred for admission and further observation.   Review of Systems:  1. General: No fevers, no chills, no weight gain or weight loss 2. ENT: No runny nose or sore throat, no hearing disturbances/ oral bleeding.  3. Pulmonary: No dyspnea, cough, wheezing, or hemoptysis 4. Cardiovascular: No angina, claudication, lower extremity edema, pnd or orthopnea 5. Gastrointestinal: No nausea or vomiting, no diarrhea or constipation. Positive dark stools.  6. Hematology: No easy bruisability or frequent infections 7. Urology: No dysuria, hematuria or increased urinary frequency 8. Dermatology: No rashes. 9. Neurology: No seizures or paresthesias 10. Musculoskeletal: No joint pain or deformities  Past Medical History:  Diagnosis Date  . 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 Arthritis   . Constipation due to pain medication     . GERD (gastroesophageal reflux disease)   . H/O staphylococcal infection   . History of hiatal hernia   . Hypertension   . Neuropathy   . Sleep apnea    CPAP     Past Surgical History:  Procedure Laterality Date  . ANTERIOR CRUCIATE LIGAMENT REPAIR     right  . APPLICATION OF ROBOTIC ASSISTANCE FOR SPINAL PROCEDURE N/A 10/28/2016   Procedure: APPLICATION OF ROBOTIC ASSISTANCE FOR SPINAL PROCEDURE;  Surgeon: Lisbeth Renshaw, MD;  Location: [facility] OR;  Service: Neurosurgery;  Laterality: N/A;  . arthroscopy     right knee  . BACK SURGERY     x 3  . CARDIAC CATHETERIZATION  2005   normal per pt.  . COLONOSCOPY WITH PROPOFOL N/A 02/21/2014   Procedure: COLONOSCOPY WITH PROPOFOL;  Surgeon: Theda Belfast, MD;  Location: [facility];  Service: Endoscopy;  Laterality: N/A;  . JOINT REPLACEMENT     right knee  . LUMBAR WOUND DEBRIDEMENT N/A 11/22/2016   Procedure: LUMBAR WOUND WASH OUT;  Surgeon: Lisbeth Renshaw, MD;  Location: [facility] OR;  Service: Neurosurgery;  Laterality: N/A;  LUMBAR WOUND WASH OUT  . MENISCUS REPAIR Left    knee     reports that he quit smoking about 13 years ago. His smoking use included cigarettes. He has never used smokeless tobacco. He reports that he drank alcohol. He reports that he does not use drugs.  Allergies  Allergen Reactions  . Penicillins Swelling    SWELLING OF FACE (when patient was a small child)  Has patient had a PCN reaction causing immediate rash, facial/tongue/throat swelling, SOB or lightheadedness with hypotension: YES PCN reaction  causing severe rash involving mucus membranes or skin necrosis:NO PCN reaction that required hospitalization NO PCN reaction occurring within the last 10 years: NO   . Iodinated Diagnostic Agents Rash    Iohexol; during cardiac cath; resolved with Benadryl    Family History  Problem Relation Age of Onset  . Breast cancer Mother   . Hypertension Mother   . Alzheimer's disease Father   . Breast cancer Sister    . Breast cancer Sister      Prior to Admission medications   Medication Sig Start Date End Date Taking? Authorizing Provider  aspirin 325 MG EC tablet Take 1 tablet (325 mg total) by mouth daily. Stopped tasking prior to procedure 11/26/16  Yes Lisbeth Renshaw, MD  cephALEXin (KEFLEX) 500 MG capsule Take 500 mg by mouth daily.   Yes [provider]  gabapentin (NEURONTIN) 300 MG capsule Take 900 mg by mouth 3 (three) times daily.    Yes [provider]  GLUCOSAMINE HCL-MSM PO Take 1 capsule by mouth 2 (two) times daily.    Yes [provider]  HYDROcodone-acetaminophen (NORCO) 10-325 MG tablet Take 1 tablet by mouth every 4 (four) hours as needed for pain. 11/11/16  Yes [provider]  lisinopril-hydrochlorothiazide (PRINZIDE,ZESTORETIC) 20-12.5 MG per tablet Take 1 tablet by mouth every morning.   Yes [provider]  meloxicam (MOBIC) 15 MG tablet Take 15 mg by mouth daily. 10/12/16  Yes [provider]  Multiple Vitamin (MULTIVITAMIN WITH MINERALS) TABS tablet Take 1 tablet by mouth daily.   Yes [provider]  omeprazole (PRILOSEC) 40 MG capsule Take 40 mg by mouth daily.    Yes [provider]  simvastatin (ZOCOR) 20 MG tablet Take 20 mg by mouth at bedtime.   Yes [provider]  tamsulosin (FLOMAX) 0.4 MG CAPS capsule Take 0.4 mg by mouth daily.   Yes [provider]  traZODone (DESYREL) 100 MG tablet Take 100 mg by mouth at bedtime.   Yes [provider]    Physical Exam: Vitals:   01/18/18 2315 01/19/18 0039 01/19/18 0515 01/19/18 0819  BP: (!) 135/108 (!) 141/77  (!) 155/70  Pulse: (!) 113 (!) 115 (!) 109 (!) 110  Resp: (!) 29 16 (!) 21 14  Temp:    99.1 F (37.3 C)  TempSrc:    Rectal  SpO2: 99% 99% 100% 99%    Constitutional: deconditioned and ill looking appearing.  Vitals:   01/18/18 2315 01/19/18 0039 01/19/18 0515 01/19/18 0819  BP: (!) 135/108 (!) 141/77  (!)  155/70  Pulse: (!) 113 (!) 115 (!) 109 (!) 110  Resp: (!) 29 16 (!) 21 14  Temp:    99.1 F (37.3 C)  TempSrc:    Rectal  SpO2: 99% 99% 100% 99%   Eyes: PERRL, lids and conjunctivae normal Head normocephalic, nose and ears with no deformities ENMT: Mucous membranes are dry. Posterior pharynx clear of any exudate or lesions. All upper teeth have been extracted, positive signs of old bleeding, no ulcerations.   Neck: normal, supple, no masses, no thyromegaly Respiratory: clear to auscultation bilaterally, no wheezing, no crackles. Normal respiratory effort. No accessory muscle use.  Cardiovascular: Regular rate and rhythm, no murmurs / rubs / gallops. No extremity edema. 2+ pedal pulses. No carotid bruits.  Abdomen: prtotuberant with no tenderness, no masses palpated. No hepatosplenomegaly. Bowel sounds positive.  Musculoskeletal: no clubbing / cyanosis. No joint deformity upper and lower extremities. Good ROM, no contractures. Normal  muscle tone.  Skin: no rashes, lesions, ulcers. No induration Neurologic: CN 2-12 grossly intact. Sensation intact, DTR normal. Strength 5/5 in all 4.     Labs on Admission: I have personally reviewed following labs and imaging studies  CBC: Recent Labs  Lab 01/18/18 2150  WBC 13.1*  NEUTROABS 9.9*  HGB 14.9  HCT 42.8  MCV 92.6  PLT 271   Basic Metabolic Panel: Recent Labs  Lab 01/18/18 2150  NA 138  K 4.4  CL 103  CO2 22  GLUCOSE 125*  BUN 44*  CREATININE 1.07  CALCIUM 9.6   GFR: CrCl cannot be calculated (Unknown ideal weight.). Liver Function Tests: No results for input(s): AST, ALT, ALKPHOS, BILITOT, PROT, ALBUMIN in the last 168 hours. No results for input(s): LIPASE, AMYLASE in the last 168 hours. No results for input(s): AMMONIA in the last 168 hours. Coagulation Profile: No results for input(s): INR, PROTIME in the last 168 hours. Cardiac Enzymes: No results for input(s): CKTOTAL, CKMB, CKMBINDEX, TROPONINI in the last 168  hours. BNP (last 3 results) No results for input(s): PROBNP in the last 8760 hours. HbA1C: No results for input(s): HGBA1C in the last 72 hours. CBG: No results for input(s): GLUCAP in the last 168 hours. Lipid Profile: No results for input(s): CHOL, HDL, LDLCALC, TRIG, CHOLHDL, LDLDIRECT in the last 72 hours. Thyroid Function Tests: No results for input(s): TSH, T4TOTAL, FREET4, T3FREE, THYROIDAB in the last 72 hours. Anemia Panel: No results for input(s): VITAMINB12, FOLATE, FERRITIN, TIBC, IRON, RETICCTPCT in the last 72 hours. Urine analysis:    Component Value Date/Time   COLORURINE YELLOW 03/15/2010 1020   APPEARANCEUR CLEAR 03/15/2010 1020   LABSPEC 1.015 03/15/2010 1020   PHURINE 6.5 03/15/2010 1020   GLUCOSEU NEGATIVE 03/15/2010 1020   HGBUR NEGATIVE 03/15/2010 1020   BILIRUBINUR NEGATIVE 03/15/2010 1020   KETONESUR NEGATIVE 03/15/2010 1020   PROTEINUR NEGATIVE 03/15/2010 1020   UROBILINOGEN 0.2 03/15/2010 1020   NITRITE NEGATIVE 03/15/2010 1020   LEUKOCYTESUR  03/15/2010 1020    NEGATIVE MICROSCOPIC NOT DONE ON URINES WITH NEGATIVE PROTEIN, BLOOD, LEUKOCYTES, NITRITE, OR GLUCOSE <1000 mg/dL.    Radiological Exams on Admission: No results found.  EKG: Independently reviewed.  Sinus rhythm 123 bpm, normal axis, normal intervals.  Assessment/Plan Active Problems:   Dehydration   62 year old male with history of hypertension, chronically on aspirin, 325 mg daily who underwent an extensive oral procedure 2 days ago, with development significant postoperative bleed, pain, decreased p.o. intake, and generalized weakness.  On initial physical examination temperature 99.1, blood pressure 155/70, heart rate 110, respiratory rate 14, oxygen saturation 99% on room air.  Patient is awake and alert, upper teeth have been extracted, positive signs of all bleeding.  Lungs are clear to auscultation, heart S1-S2 present, rhythmic tachycardic, abdomen protuberant but nontender, no  lower extremity edema.  Sodium 138, potassium 4.4, chloride 103, bicarb 22, glucose 125, BUN 44, creatinine 1.07, white count 13.1, hemoglobin 14.9, hematocrit 42.8, platelets 271.   Patient will be admitted to the hospital with working diagnosis of severe dehydration due to poor oral intake and oral bleeding.   1.  Acute dehydration due to poor oral intake.  Patient will be admitted to the medical ward, he will be placed on intravenous fluids, IV antiacids, IV analgesics, and as needed antiemetics.  Soft diet and advance as tolerated.  Physical therapy evaluation.  Continue antibiotic therapy postoperatively, with cephalexin.  2.  Hypertension.  We will continue IV fluids for hydration,  hold on lisinopril/hydrochlorothiazide for now, blood pressure stable on admission.   3.  Dyslipidemia.  Continue simvastatin.  4.  Depression.  Continue trazodone.  5.  BPH.  Continue tamsulosin.  DVT prophylaxis:  scd Code Status:   full Family Communication: I spoke with patient's sun at the bedside and all questions were addressed.   Disposition Plan: home in am  Consults called:  none  Admission status: Observation.     Ashlon Lottman Annett Gula MD [facility] Pager [phone]  If 7PM-7AM, please contact night-coverage [email or web address] Password [email or web address]  01/19/2018, 8:55 AM

## 2018-01-19 NOTE — ED Notes (Signed)
Pt is unable to stand due to medication

## 2018-01-19 NOTE — ED Notes (Addendum)
Pt has taken his own pain medicine, pt has slurred speech and  is too unsteady to stand.

## 2018-01-19 NOTE — ED Notes (Signed)
NT attempted to ambulate pt. Pt is still confused and is unable to stand and ambulate  [name], Pt's son can be contacted at [phone] (home) [phone]

## 2018-01-19 NOTE — ED Notes (Signed)
Pt unable to be still for OSVS, pt is mumbling and asking for help.

## 2018-01-19 NOTE — ED Provider Notes (Signed)
Patient signed out to me by Dr. Effie Shy and continues to be tachycardic and weak.  BUN is elevated on his initial labs.  Has been evaluated in the ED for the past 18 hours and patient will be admitted for further management   Lorre Nick, MD 01/19/18 [phone]

## 2018-01-20 DIAGNOSIS — D649 Anemia, unspecified: Secondary | ICD-10-CM | POA: Diagnosis not present

## 2018-01-20 DIAGNOSIS — E785 Hyperlipidemia, unspecified: Secondary | ICD-10-CM | POA: Diagnosis not present

## 2018-01-20 DIAGNOSIS — E876 Hypokalemia: Secondary | ICD-10-CM | POA: Diagnosis not present

## 2018-01-20 DIAGNOSIS — N400 Enlarged prostate without lower urinary tract symptoms: Secondary | ICD-10-CM | POA: Diagnosis not present

## 2018-01-20 DIAGNOSIS — E860 Dehydration: Secondary | ICD-10-CM | POA: Diagnosis not present

## 2018-01-20 LAB — COMPREHENSIVE METABOLIC PANEL
ALBUMIN: 3.6 g/dL (ref 3.5–5.0)
ALK PHOS: 44 U/L (ref 38–126)
ALT: 21 U/L (ref 17–63)
AST: 57 U/L — AB (ref 15–41)
Anion gap: 10 (ref 5–15)
BILIRUBIN TOTAL: 0.7 mg/dL (ref 0.3–1.2)
BUN: 28 mg/dL — AB (ref 6–20)
CALCIUM: 9 mg/dL (ref 8.9–10.3)
CO2: 23 mmol/L (ref 22–32)
CREATININE: 0.9 mg/dL (ref 0.61–1.24)
Chloride: 103 mmol/L (ref 101–111)
GFR calc Af Amer: >60 mL/min (ref >60)
GFR calc non Af Amer: >60 mL/min (ref >60)
GLUCOSE: 136 mg/dL — AB (ref 65–99)
Potassium: 3.4 mmol/L — ABNORMAL LOW (ref 3.5–5.1)
Sodium: 136 mmol/L (ref 135–145)
Total Protein: 6.4 g/dL — ABNORMAL LOW (ref 6.5–8.1)

## 2018-01-20 LAB — CBC
HCT: 32.1 % — ABNORMAL LOW (ref 39.0–52.0)
HEMOGLOBIN: 10.8 g/dL — AB (ref 13.0–17.0)
MCH: 31.3 pg (ref 26.0–34.0)
MCHC: 33.6 g/dL (ref 30.0–36.0)
MCV: 93 fL (ref 78.0–100.0)
PLATELETS: 226 10*3/uL (ref 150–400)
RBC: 3.45 MIL/uL — ABNORMAL LOW (ref 4.22–5.81)
RDW: 13.7 % (ref 11.5–15.5)
WBC: 8.8 10*3/uL (ref 4.0–10.5)

## 2018-01-20 LAB — HEMOGLOBIN AND HEMATOCRIT, BLOOD
HEMATOCRIT: 30 % — AB (ref 39.0–52.0)
Hemoglobin: 10 g/dL — ABNORMAL LOW (ref 13.0–17.0)

## 2018-01-20 LAB — HIV ANTIBODY (ROUTINE TESTING W REFLEX): HIV Screen 4th Generation wRfx: NONREACTIVE

## 2018-01-20 MED ORDER — POTASSIUM CHLORIDE 20 MEQ/15ML (10%) PO SOLN
40.0000 meq | Freq: Every day | ORAL | Status: DC
  Administered 2018-01-20: 40 meq via ORAL
  Filled 2018-01-20: qty 30

## 2018-01-20 NOTE — Discharge Summary (Signed)
Physician Discharge Summary  [name]:[id number] DOB: [birth year] DOA: 01/18/2018  PCP: Barbie Banner, MD  Admit date: 01/18/2018 Discharge date: 01/20/2018  Time spent: 45 minutes  Recommendations for Outpatient Follow-up:  Patient will be discharged to home.  Patient will need to follow up with primary care provider within one week of discharge, repeat CBC and BMP. Follow up with your dentist or surgeon.  Patient should continue medications as prescribed.  Patient should follow a soft diet.   Discharge Diagnoses:  Acute dehydration Essential hypertension Dyslipidemia BPH Normocytic anemia Hypokalemia  Discharge Condition: Stable  Diet recommendation: Soft   There were no vitals filed for this visit.  History of present illness:  On 01/19/2018 by Dr. Delrae Sawyers Arrien  [name] is a 62 y.o. male with medical history significant of periodontal disease, with recent all upper teeth extraction  Jan 17, 2018. Patient did not stop taking aspiring preoperatively.  He has been experiencing severe oral pain, and persistent bleeding after the oral surgical intervention.  The pain has been persistent over the last 48 hours, moderate to severe in intensity, no improving factors, no worsening factors, associated with significant decreased oral intake and generalized weakness, no nausea or vomiting.  Last night he had a collapsing episode with no loss of consciousness, then he was brought to the hospital for further evaluation.  It is unclear if patient was taken more narcotics than originally prescribed.  Hospital Course:  Acute dehydration -Secondary to poor oral intake. Recently had all of his upper teeth extracted on Jan 17, 2018. -Patient was placed on IV fluids as well as IV analgesics and antacids -He was able to tolerate a soft diet -PT evaluated patient, no further therapy needs -Continue cephalexin (preadmission antibiotic)  Essential hypertension -Continue home  medications on discharge including lisinopril, HCTZ  Dyslipidemia -Continue statin  BPH -Continue tamsulosin  Normocytic anemia -Upon admission, hemoglobin was 14 (possibly hemoconcentration), dropped to 10 -Suspect this is secondary to delusional component although patient continue taking his aspirin prior to tooth extraction and did have bleeding. -Currently no longer bleeding.  -Repeat CBC in 1 week -Of note, reviewed epic, hemoglobin back in 2010 was approximately 9-10  Hypokalemia -Replaced, would repeat BMP in 1 week  Procedures: None  Consultations: None  Discharge Exam: Vitals:   01/19/18 2123 01/20/18 0520  BP: (!) 127/58 (!) 107/57  Pulse: (!) 108 (!) 103  Resp: 18 20  Temp: 99.1 F (37.3 C) 98.1 F (36.7 C)  SpO2: 98% 98%   Patient currently feeling much better.  Denies current chest pain, shortness of breath, abdominal pain, nausea vomiting, diarrhea or constipation.  Would like to go home.   General: Well developed, well nourished, NAD, appears stated age  HEENT: NCAT, PERRLA, EOMI, Anicteic Sclera, mucous membranes moist.  Neck: Supple, no JVD, no masses  Cardiovascular: S1 S2 auscultated, no rubs, murmurs or gallops. Regular rate and rhythm.  Respiratory: Clear to auscultation bilaterally with equal chest rise  Abdomen: Soft, nontender, nondistended, + bowel sounds  Extremities: warm dry without cyanosis clubbing or edema  Neuro: AAOx3, cranial nerves grossly intact. Strength 5/5 in patient's upper and lower extremities bilaterally  Skin: Without rashes exudates or nodules  Psych: Normal affect and demeanor with intact judgement and insight  Discharge Instructions Discharge Instructions    Discharge instructions   Complete by:  As directed    Patient will need to follow up with primary care provider within one week of discharge, repeat CBC  and BMP. Follow up with your dentist or surgeon.  Patient should continue medications as prescribed.   Patient should follow a soft diet.     Allergies as of 01/20/2018      Reactions   Penicillins Swelling   SWELLING OF FACE (when patient was a small child)  Has patient had a PCN reaction causing immediate rash, facial/tongue/throat swelling, SOB or lightheadedness with hypotension: YES PCN reaction causing severe rash involving mucus membranes or skin necrosis:NO PCN reaction that required hospitalization NO PCN reaction occurring within the last 10 years: NO   Iodinated Diagnostic Agents Rash   Iohexol; during cardiac cath; resolved with Benadryl      Medication List    STOP taking these medications   aspirin 325 MG EC tablet     TAKE these medications   cephALEXin 500 MG capsule Commonly known as:  KEFLEX Take 500 mg by mouth daily.   gabapentin 300 MG capsule Commonly known as:  NEURONTIN Take 900 mg by mouth 3 (three) times daily.   GLUCOSAMINE HCL-MSM PO Take 1 capsule by mouth 2 (two) times daily.   HYDROcodone-acetaminophen 10-325 MG tablet Commonly known as:  NORCO Take 1 tablet by mouth every 4 (four) hours as needed for pain.   lisinopril-hydrochlorothiazide 20-12.5 MG tablet Commonly known as:  PRINZIDE,ZESTORETIC Take 1 tablet by mouth every morning.   meloxicam 15 MG tablet Commonly known as:  MOBIC Take 15 mg by mouth daily.   multivitamin with minerals Tabs tablet Take 1 tablet by mouth daily.   omeprazole 40 MG capsule Commonly known as:  PRILOSEC Take 40 mg by mouth daily.   simvastatin 20 MG tablet Commonly known as:  ZOCOR Take 20 mg by mouth at bedtime.   tamsulosin 0.4 MG Caps capsule Commonly known as:  FLOMAX Take 0.4 mg by mouth daily.   traZODone 100 MG tablet Commonly known as:  DESYREL Take 100 mg by mouth at bedtime.      Allergies  Allergen Reactions  . Penicillins Swelling    SWELLING OF FACE (when patient was a small child)  Has patient had a PCN reaction causing immediate rash, facial/tongue/throat swelling, SOB or  lightheadedness with hypotension: YES PCN reaction causing severe rash involving mucus membranes or skin necrosis:NO PCN reaction that required hospitalization NO PCN reaction occurring within the last 10 years: NO   . Iodinated Diagnostic Agents Rash    Iohexol; during cardiac cath; resolved with Benadryl   Follow-up Information    Schedule an appointment as soon as possible for a visit  with Orthodontist.   Why:  As needed       Barbie Banner, MD. Schedule an appointment as soon as possible for a visit in 1 week(s).   Specialty:  Family Medicine Why:  Hospital follow up  Contact information: [address] [place] [address] [phone]            The results of significant diagnostics from this hospitalization (including imaging, microbiology, ancillary and laboratory) are listed below for reference.    Significant Diagnostic Studies: No results found.  Microbiology: No results found for this or any previous visit (from the past 240 hour(s)).   Labs: Basic Metabolic Panel: Recent Labs  Lab 01/18/18 2150 01/20/18 0456  NA 138 136  K 4.4 3.4*  CL 103 103  CO2 22 23  GLUCOSE 125* 136*  BUN 44* 28*  CREATININE 1.07 0.90  CALCIUM 9.6 9.0   Liver Function Tests: Recent  Labs  Lab 01/20/18 0456  AST 57*  ALT 21  ALKPHOS 44  BILITOT 0.7  PROT 6.4*  ALBUMIN 3.6   No results for input(s): LIPASE, AMYLASE in the last 168 hours. No results for input(s): AMMONIA in the last 168 hours. CBC: Recent Labs  Lab 01/18/18 2150 01/20/18 0738 01/20/18 1210  WBC 13.1* 8.8  --   NEUTROABS 9.9*  --   --   HGB 14.9 10.8* 10.0*  HCT 42.8 32.1* 30.0*  MCV 92.6 93.0  --   PLT 271 226  --    Cardiac Enzymes: No results for input(s): CKTOTAL, CKMB, CKMBINDEX, TROPONINI in the last 168 hours. BNP: BNP (last 3 results) No results for input(s): BNP in the last 8760 hours.  ProBNP (last 3 results) No results for input(s): PROBNP in the last 8760  hours.  CBG: No results for input(s): GLUCAP in the last 168 hours.     Signed:  Edsel Petrin  [facility] 01/20/2018, 1:31 PM

## 2018-01-20 NOTE — Evaluation (Signed)
Physical Therapy Evaluation Patient Details Name: [name] MRN: [id number] DOB: [birth year] Today's Date: 01/20/2018   History of Present Illness  [name] is a 62 y.o. male with medical history significant of periodontal disease, with recent all upper teeth extraction.He has been experiencing severe oral pain, and persistent bleeding after the oral surgical intervention. He has had significant decreased oral intake and generalized weakness.  Clinical Impression  Pt admitted with above diagnosis. Pt currently with functional limitations due to the deficits listed below (see PT Problem List).  Pt will benefit from skilled PT to increase their independence and safety with mobility to allow discharge to the venue listed below.  Pt a bit unsteady initially, but improved as gait progressed.  Pt instructed to change positions slowly to decrease any dizziness.     Follow Up Recommendations No PT follow up    Equipment Recommendations  None recommended by PT    Recommendations for Other Services       Precautions / Restrictions        Mobility  Bed Mobility Overal bed mobility: Independent                Transfers Overall transfer level: Needs assistance   Transfers: Sit to/from Stand Sit to Stand: Supervision         General transfer comment: S for safety  Ambulation/Gait Ambulation/Gait assistance: Min guard;+2 safety/equipment Ambulation Distance (Feet): 250 Feet Assistive device: (IV pole) Gait Pattern/deviations: Drifts right/left;Decreased step length - right;Decreased step length - left     General Gait Details: Pt initially a bit unsteady, but this improved as gait progressed. Pt reports feeling a bit weak due to not having eaten.  Stairs            Wheelchair Mobility    Modified Rankin (Stroke Patients Only)       Balance Overall balance assessment: Mild deficits observed, not formally tested                                            Pertinent Vitals/Pain Pain Assessment: No/denies pain    Home Living Family/patient expects to be discharged to:: Private residence Living Arrangements: Children   Type of Home: House Home Access: Stairs to enter       Home Equipment: Environmental consultant - 2 wheels      Prior Function Level of Independence: Independent               Hand Dominance        Extremity/Trunk Assessment   Upper Extremity Assessment Upper Extremity Assessment: Overall WFL for tasks assessed    Lower Extremity Assessment Lower Extremity Assessment: Overall WFL for tasks assessed       Communication   Communication: No difficulties  Cognition Arousal/Alertness: Awake/alert Behavior During Therapy: WFL for tasks assessed/performed Overall Cognitive Status: Within Functional Limits for tasks assessed                                        General Comments      Exercises     Assessment/Plan    PT Assessment Patient needs continued PT services  PT Problem List Decreased activity tolerance;Decreased balance;Decreased mobility       PT Treatment Interventions Gait training;Functional mobility training;Balance training;Therapeutic exercise;Therapeutic activities  PT Goals (Current goals can be found in the Care Plan section)  Acute Rehab PT Goals Patient Stated Goal: home PT Goal Formulation: With patient Time For Goal Achievement: 02/03/18 Potential to Achieve Goals: Good    Frequency Min 3X/week   Barriers to discharge        Co-evaluation               AM-PAC PT "6 Clicks" Daily Activity  Outcome Measure Difficulty turning over in bed (including adjusting bedclothes, sheets and blankets)?: None Difficulty moving from lying on back to sitting on the side of the bed? : None Difficulty sitting down on and standing up from a chair with arms (e.g., wheelchair, bedside commode, etc,.)?: None Help needed moving to and from a bed to chair  (including a wheelchair)?: A Little Help needed walking in hospital room?: A Little Help needed climbing 3-5 steps with a railing? : A Little 6 Click Score: 21    End of Session Equipment Utilized During Treatment: Gait belt Activity Tolerance: Patient tolerated treatment well Patient left: in bed(sitting EOB) Nurse Communication: Mobility status PT Visit Diagnosis: Difficulty in walking, not elsewhere classified (R26.2)    Time: 1610-9604 PT Time Calculation (min) (ACUTE ONLY): 11 min   Charges:   PT Evaluation $PT Eval Low Complexity: 1 Low     PT G Codes:        Maleeha Halls L. Katrinka Blazing, [place] Pager [phone] 01/20/2018   Enzo Montgomery 01/20/2018, 1:10 PM

## 2018-01-20 NOTE — Discharge Instructions (Signed)
You should only take pain medication as directed. Deviating can cause severe complications, such as low blood pressure, decreased respiratory drive, syncope, and even death.   Dehydration, Adult Dehydration is when there is not enough fluid or water in your body. This happens when you lose more fluids than you take in. Dehydration can range from mild to very bad. It should be treated right away to keep it from getting very bad. Symptoms of mild dehydration may include:  Thirst.  Dry lips.  Slightly dry mouth.  Dry, warm skin.  Dizziness. Symptoms of moderate dehydration may include:  Very dry mouth.  Muscle cramps.  Dark pee (urine). Pee may be the color of tea.  Your body making less pee.  Your eyes making fewer tears.  Heartbeat that is uneven or faster than normal (palpitations).  Headache.  Light-headedness, especially when you stand up from sitting.  Fainting (syncope). Symptoms of very bad dehydration may include:  Changes in skin, such as: ? Cold and clammy skin. ? Blotchy (mottled) or pale skin. ? Skin that does not quickly return to normal after being lightly pinched and let go (poor skin turgor).  Changes in body fluids, such as: ? Feeling very thirsty. ? Your eyes making fewer tears. ? Not sweating when body temperature is high, such as in hot weather. ? Your body making very little pee.  Changes in vital signs, such as: ? Weak pulse. ? Pulse that is more than 100 beats a minute when you are sitting still. ? Fast breathing. ? Low blood pressure.  Other changes, such as: ? Sunken eyes. ? Cold hands and feet. ? Confusion. ? Lack of energy (lethargy). ? Trouble waking up from sleep. ? Short-term weight loss. ? Unconsciousness. Follow these instructions at home:  If told by your doctor, drink an ORS: ? Make an ORS by using instructions on the package. ? Start by drinking small amounts, about  cup (120 mL) every 5-10 minutes. ? Slowly drink  more until you have had the amount that your doctor said to have.  Drink enough clear fluid to keep your pee clear or pale yellow. If you were told to drink an ORS, finish the ORS first, then start slowly drinking clear fluids. Drink fluids such as: ? Water. Do not drink only water by itself. Doing that can make the salt (sodium) level in your body get too low (hyponatremia). ? Ice chips. ? Fruit juice that you have added water to (diluted). ? Low-calorie sports drinks.  Avoid: ? Alcohol. ? Drinks that have a lot of sugar. These include high-calorie sports drinks, fruit juice that does not have water added, and soda. ? Caffeine. ? Foods that are greasy or have a lot of fat or sugar.  Take over-the-counter and prescription medicines only as told by your doctor.  Do not take salt tablets. Doing that can make the salt level in your body get too high (hypernatremia).  Eat foods that have minerals (electrolytes). Examples include bananas, oranges, potatoes, tomatoes, and spinach.  Keep all follow-up visits as told by your doctor. This is important. Contact a doctor if:  You have belly (abdominal) pain that: ? Gets worse. ? Stays in one area (localizes).  You have a rash.  You have a stiff neck.  You get angry or annoyed more easily than normal (irritability).  You are more sleepy than normal.  You have a harder time waking up than normal.  You feel: ? Weak. ? Dizzy. ? Very  thirsty.  You have peed (urinated) only a small amount of very dark pee during 6-8 hours. Get help right away if:  You have symptoms of very bad dehydration.  You cannot drink fluids without throwing up (vomiting).  Your symptoms get worse with treatment.  You have a fever.  You have a very bad headache.  You are throwing up or having watery poop (diarrhea) and it: ? Gets worse. ? Does not go away.  You have blood or something green (bile) in your throw-up.  You have blood in your poop  (stool). This may cause poop to look black and tarry.  You have not peed in 6-8 hours.  You pass out (faint).  Your heart rate when you are sitting still is more than 100 beats a minute.  You have trouble breathing. This information is not intended to replace advice given to you by your health care provider. Make sure you discuss any questions you have with your health care provider. Document Released: 07/02/2009 Document Revised: 03/25/2016 Document Reviewed: 10/30/2015 Elsevier Interactive Patient Education  2018 [employer].

## 2018-01-20 NOTE — Progress Notes (Signed)
Discharge instructions and mediations discussed with patient. AVS given to patient.  All questions answered.

## 2018-11-25 IMAGING — CT CT L SPINE W/O CM
3 of 4 series · 12 of 33 positions shown, 14 images · non-contrast
Comparison: Lumbar myelogram CT 08/30/2016.

CLINICAL DATA: Spondylolisthesis.  History of L5-S1 fusion.

EXAM:
CT LUMBAR SPINE WITHOUT CONTRAST
TECHNIQUE: Multidetector CT imaging of the lumbar spine was performed without
intravenous contrast administration. Multiplanar CT image
reconstructions were also generated.

[Series 6: sagittal bone · sagittal · 0.42mm/px · 5 of 61 slices shown, 6 images]
[im 21/61  bone]
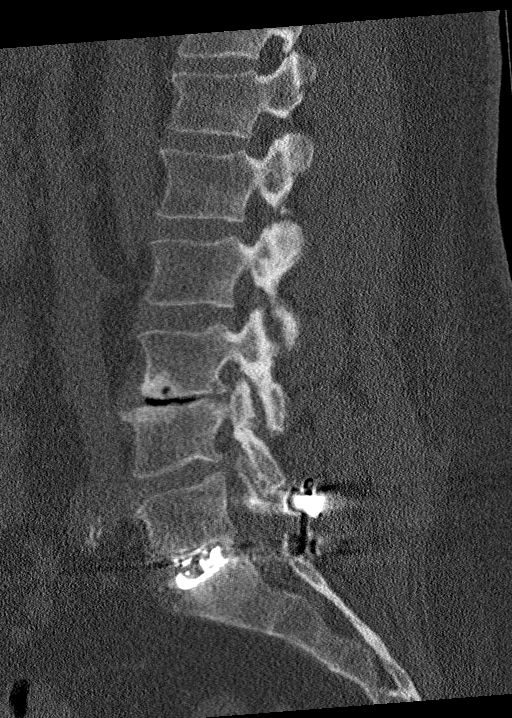
[im 26/61  bone]
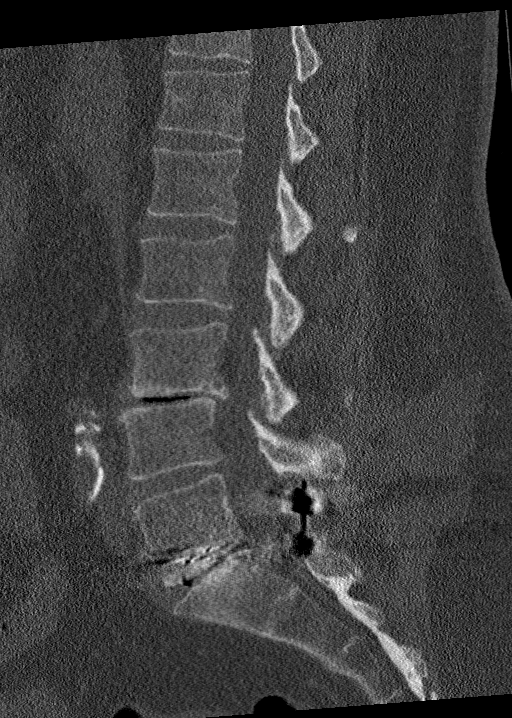
[im 31/61  soft-tissue]
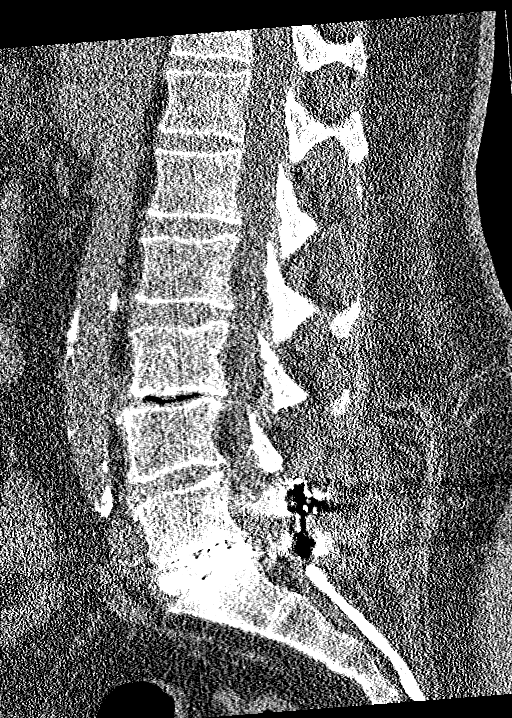
[im 31/61  bone]
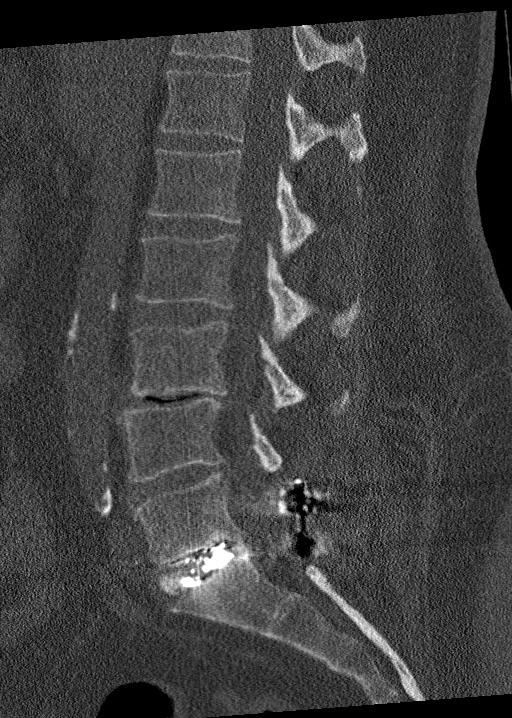
[im 36/61  bone]
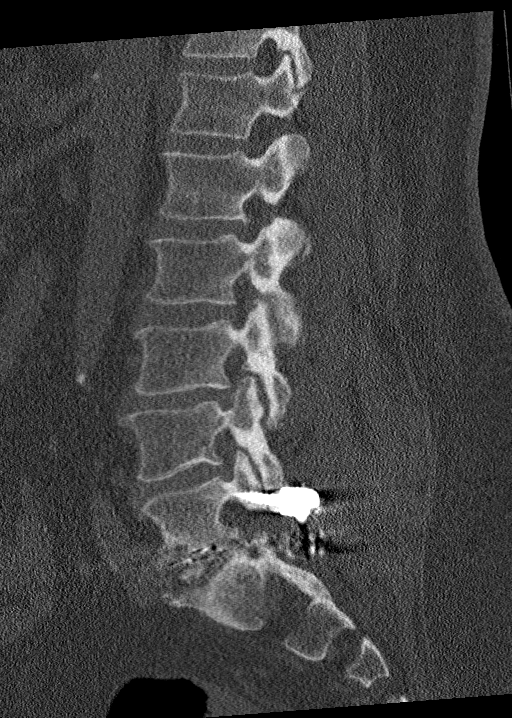
[im 41/61  bone]
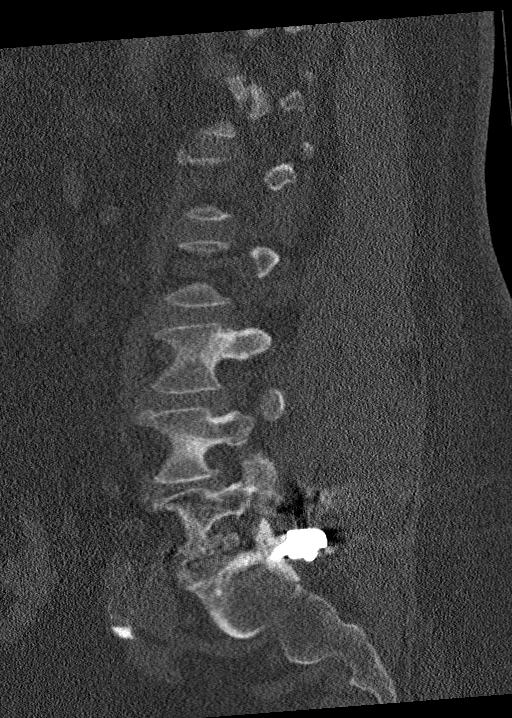

[Series 7: coronal bone · coronal · 0.42mm/px · 3 of 76 slices shown]
[im 16/76  bone]
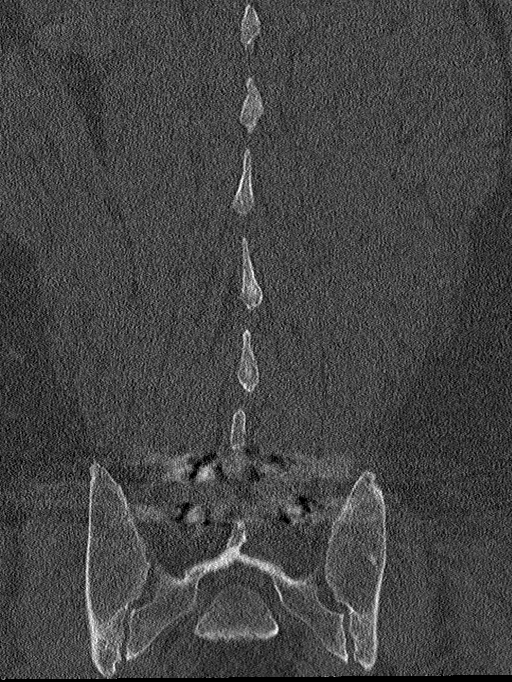
[im 31/76  bone]
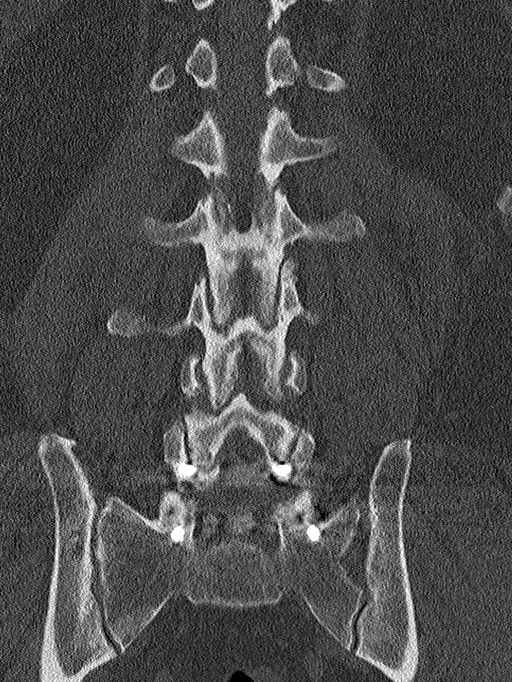
[im 46/76  bone]
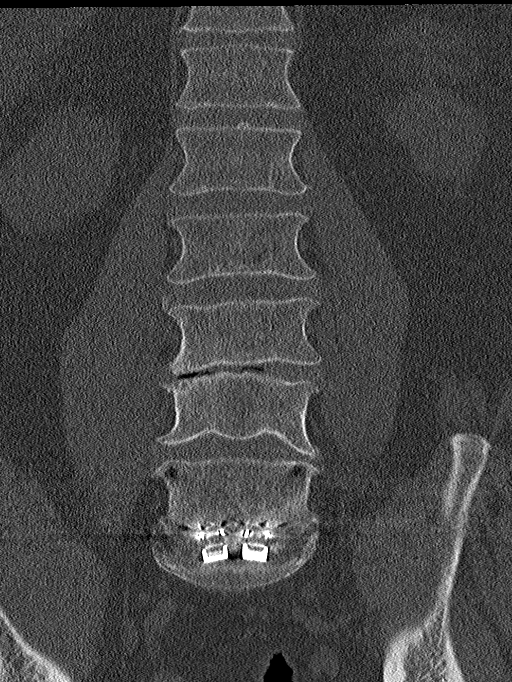

[Series 9: (person_name) · axial · 0.33mm/px · z∈[-209,-38]mm · 4 of 476 slices shown, 5 images]
[im 96/476  soft-tissue]
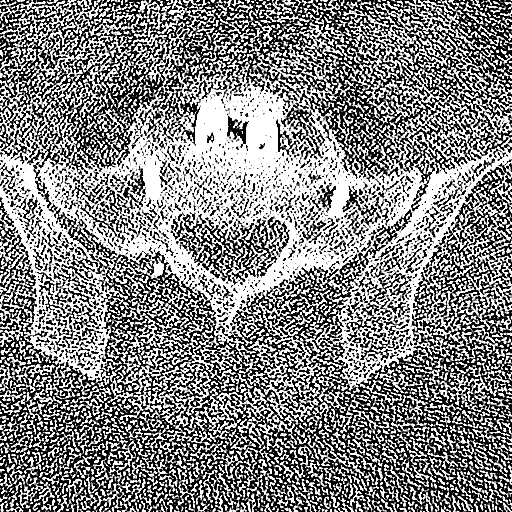
[im 96/476  bone]
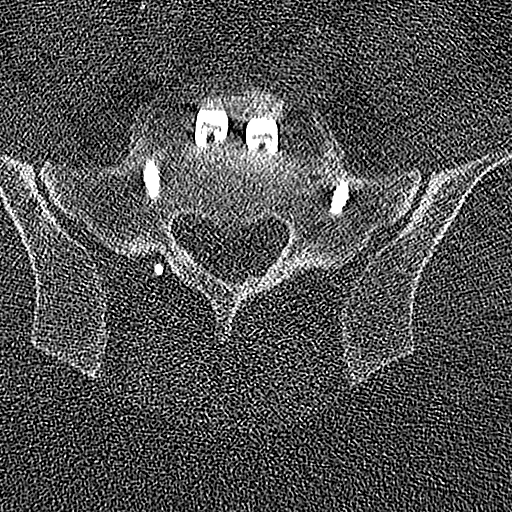
[im 191/476  bone]
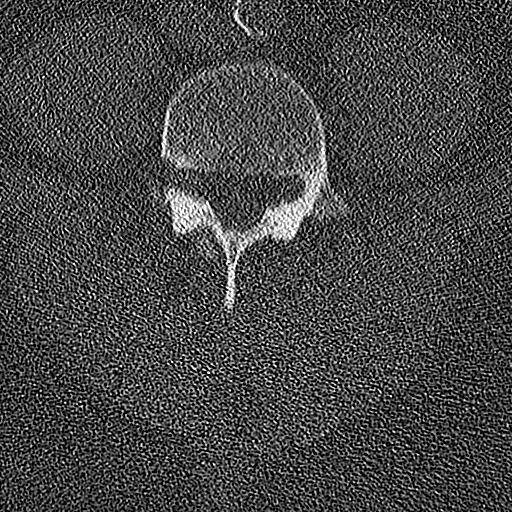
[im 286/476  bone]
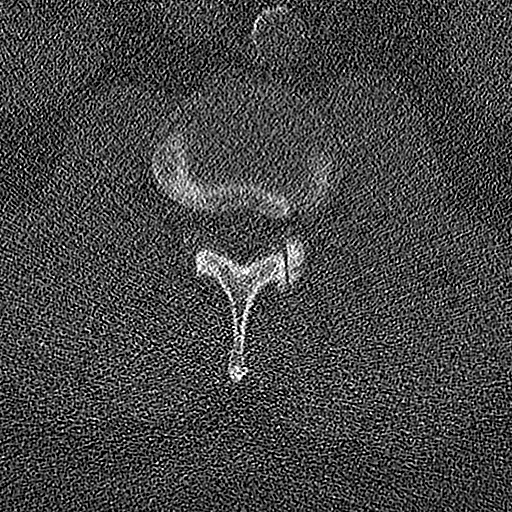
[im 381/476  bone]
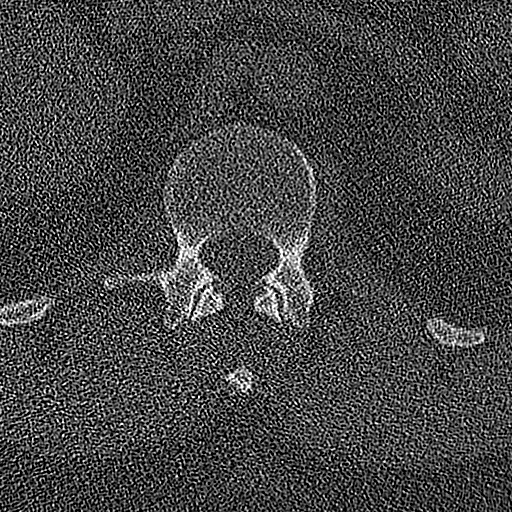

[12 of 33 positions shown; findings below may reference images not displayed]

FINDINGS: Segmentation: Normal.

Alignment: Stable with 4 mm of retrolisthesis at L3-4.

Vertebrae: No evidence of acute fracture or dislocation. There is
advanced disc and endplate degeneration with vacuum phenomena
asymmetric to the right at L3-4. Patient is status post laminectomy
and PLIF at L5-S1. Interbody fusion appears solid. There are
sacroiliac degenerative changes with vacuum phenomenon bilaterally.

Paraspinal and other soft tissues: The paraspinal soft tissues
appear unremarkable. There is aortoiliac atherosclerosis.

Disc levels: No significant disc space findings are seen from T11-12
through L1-2.

L2-3: Disc height is maintained. There is mild disc bulging and
facet hypertrophy. No spinal stenosis or nerve root encroachment.

L3-4: Stable chronic degenerative disc disease with loss of disc
height, endplate osteophytes and vacuum phenomenon asymmetric to the
right. The resulting mild-to-moderate spinal stenosis and mild right
foraminal narrowing are unchanged from the recent prior study.

L4-5: Postsurgical changes status post left laminectomy. There is
stable annular disc bulging with right-greater-than-left facet
hypertrophy, contributing to mild spinal stenosis. The foramina
appear patent.

L5-S1: Status post laminectomy and PLIF. The spinal canal is
decompressed. No evidence of foraminal compromise.
IMPRESSION: 1. Stable appearance of the lumbar spine compared with myelogram CT
performed 6 weeks ago.
2. Chronic degenerative disc disease at L3-4 with loss of disc
height, endplate osteophytes and vacuum phenomena asymmetric to the
right. There is resulting mild-to-moderate spinal stenosis and mild
right foraminal narrowing.
3. Stable mild spinal stenosis at L4-5 status post left laminectomy.
4. Stable findings status post L5-S1 PLIF.

## 2019-10-08 ENCOUNTER — Other Ambulatory Visit: Payer: Self-pay

## 2019-10-08 ENCOUNTER — Ambulatory Visit (INDEPENDENT_AMBULATORY_CARE_PROVIDER_SITE_OTHER): Payer: Medicare Other | Admitting: Neurology

## 2019-10-08 ENCOUNTER — Encounter: Payer: Self-pay | Admitting: Neurology

## 2019-10-08 ENCOUNTER — Ambulatory Visit: Payer: Medicare Other | Admitting: Neurology

## 2019-10-08 DIAGNOSIS — G5603 Carpal tunnel syndrome, bilateral upper limbs: Secondary | ICD-10-CM | POA: Diagnosis not present

## 2019-10-08 HISTORY — DX: Carpal tunnel syndrome, bilateral upper limbs: G56.03

## 2019-10-08 NOTE — Progress Notes (Signed)
Please refer to EMG and nerve conduction procedure note.  

## 2019-10-08 NOTE — Progress Notes (Signed)
[facility]    Nerve / Sites Muscle Latency Ref. Amplitude Ref. Rel Amp Segments Distance Velocity Ref. Area    ms ms mV mV %  cm m/s m/s mVms  R Median - APB     Wrist APB 5.2 ?4.4 0.7 ?4.0 100 Wrist - APB 7   3.0     Upper arm APB 12.6  0.3  46.4 Upper arm - Wrist 24 33 ?49 1.0  L Median - APB     Wrist APB 5.1 ?4.4 2.9 ?4.0 100 Wrist - APB 7   7.1     Upper arm APB 9.4  1.8  63.6 Upper arm - Wrist 23 54 ?49 6.6  R Ulnar - ADM     Wrist ADM 3.3 ?3.3 6.3 ?6.0 100 Wrist - ADM 7   13.3     B.Elbow ADM 7.1  10.1  160 B.Elbow - Wrist 22 56 ?49 28.6     A.Elbow ADM 8.8  9.5  94 A.Elbow - B.Elbow 10 59 ?49 26.6         A.Elbow - Wrist      L Ulnar - ADM     Wrist ADM 2.9 ?3.3 12.1 ?6.0 100 Wrist - ADM 7   35.9     B.Elbow ADM 7.2  10.7  88.9 B.Elbow - Wrist 22 51 ?49 34.6     A.Elbow ADM 9.1  11.0  103 A.Elbow - B.Elbow 10 51 ?49 35.8         A.Elbow - Wrist                 [facility]    Nerve / Sites Rec. Site Peak Lat Ref.  Amp Ref. Segments Distance    ms ms V V  cm  R Radial - Anatomical snuff box (Forearm)     Forearm Wrist 2.6 ?2.9 22 ?15 Forearm - Wrist 10  L Radial - Anatomical snuff box (Forearm)     Forearm Wrist 2.3 ?2.9 15 ?15 Forearm - Wrist 10  R Median - Orthodromic (Dig II, Mid palm)     Dig II Wrist 4.1 ?3.4 4 ?10 Dig II - Wrist 13  L Median - Orthodromic (Dig II, Mid palm)     Dig II Wrist 4.3 ?3.4 4 ?10 Dig II - Wrist 13  R Ulnar - Orthodromic, (Dig V, Mid palm)     Dig V Wrist 2.7 ?3.1 4 ?5 Dig V - Wrist 11  L Ulnar - Orthodromic, (Dig V, Mid palm)     Dig V Wrist 2.8 ?3.1 4 ?5 Dig V - Wrist 74                 F  Wave    Nerve F Lat Ref.   ms ms  R Ulnar - ADM 34.3 ?32.0  L Ulnar - ADM 31.3 ?32.0

## 2019-10-08 NOTE — Procedures (Signed)
     HISTORY:  [name] is a 64 year old gentleman with a 1 year history of bilateral hand numbness that is worse on the right than the left.  He denies any neck pain or pain down the arms on either side.  He is being evaluated for possible neuropathy or a cervical radiculopathy.  NERVE CONDUCTION STUDIES:  Nerve conduction studies were performed on both upper extremities.  The distal motor latencies for the median nerves were prolonged bilaterally with low motor amplitudes for these nerves bilaterally.  The distal motor latencies and motor amplitudes for the ulnar nerves were normal bilaterally.  Slowing was seen for the right median nerve, a normal nerve conduction velocity for the left median nerve was noted.  The nerve conduction velocities for the ulnar nerves were normal bilaterally.  The sensory latencies for the ulnar nerves bilaterally and for the radial nerves bilaterally were normal with prolongation of the median sensory latencies bilaterally.  The F-wave latencies for the ulnar nerves were slightly prolonged on the right, normal on the left.  EMG STUDIES:  EMG study was performed on the right upper extremity:  The first dorsal interosseous muscle reveals 2 to 4 K units with full recruitment. No fibrillations or positive waves were noted. The abductor pollicis brevis muscle reveals 2 to 4 K units with decreased recruitment. 2+ positive waves were noted. The extensor indicis proprius muscle reveals 1 to 3 K units with full recruitment. No fibrillations or positive waves were noted. The pronator teres muscle reveals 2 to 3 K units with full recruitment. No fibrillations or positive waves were noted. The biceps muscle reveals 1 to 2 K units with full recruitment. No fibrillations or positive waves were noted. The triceps muscle reveals 2 to 4 K units with full recruitment. No fibrillations or positive waves were noted. The anterior deltoid muscle reveals 2 to 3 K units with full  recruitment. No fibrillations or positive waves were noted. The cervical paraspinal muscles were tested at 2 levels. No abnormalities of insertional activity were seen at either level tested. There was good relaxation.   IMPRESSION:  Nerve conduction studies done on both upper extremities shows evidence of bilateral carpal tunnel syndrome of moderate to severe severity on the right, moderate severity on the left.  EMG evaluation of the right upper extremity shows findings consistent with carpal tunnel syndrome but otherwise no evidence of an overlying cervical radiculopathy.  Marlan Palau MD 10/08/2019 2:08 PM  [facility] [address] [place], [place] [address]  Phone [phone] Fax [phone]
